# Patient Record
Sex: Male | Born: 1940 | Race: White | Hispanic: No | Marital: Married | Smoking: Former smoker
Health system: Southern US, Community
[De-identification: ages and names within clinical notes are randomized; demographics above are authoritative.]

## PROBLEM LIST (undated history)

## (undated) DIAGNOSIS — I10 Essential (primary) hypertension: Secondary | ICD-10-CM

## (undated) DIAGNOSIS — C801 Malignant (primary) neoplasm, unspecified: Secondary | ICD-10-CM

## (undated) DIAGNOSIS — E7800 Pure hypercholesterolemia, unspecified: Secondary | ICD-10-CM

## (undated) DIAGNOSIS — Z0389 Encounter for observation for other suspected diseases and conditions ruled out: Secondary | ICD-10-CM

## (undated) DIAGNOSIS — N261 Atrophy of kidney (terminal): Secondary | ICD-10-CM

## (undated) DIAGNOSIS — I714 Abdominal aortic aneurysm, without rupture: Secondary | ICD-10-CM

## (undated) DIAGNOSIS — I701 Atherosclerosis of renal artery: Secondary | ICD-10-CM

## (undated) HISTORY — DX: Pure hypercholesterolemia, unspecified: E78.00

## (undated) HISTORY — DX: Atherosclerosis of renal artery: I70.1

## (undated) HISTORY — DX: Abdominal aortic aneurysm, without rupture: I71.4

## (undated) HISTORY — DX: Encounter for observation for other suspected diseases and conditions ruled out: Z03.89

## (undated) HISTORY — DX: Essential (primary) hypertension: I10

---
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
## 1977-09-20 HISTORY — PX: GANGLION CYST EXCISION: SHX1691

## 1988-09-20 HISTORY — PX: VASECTOMY: SHX75

## 1993-09-20 HISTORY — PX: CARDIAC CATHETERIZATION: SHX172

## 1994-09-20 DIAGNOSIS — IMO0001 Reserved for inherently not codable concepts without codable children: Secondary | ICD-10-CM

## 1994-09-20 HISTORY — PX: OTHER SURGICAL HISTORY: SHX169

## 1994-09-20 HISTORY — DX: Reserved for inherently not codable concepts without codable children: IMO0001

## 1994-12-20 DIAGNOSIS — I701 Atherosclerosis of renal artery: Secondary | ICD-10-CM

## 1994-12-20 HISTORY — DX: Atherosclerosis of renal artery: I70.1

## 2010-03-30 ENCOUNTER — Ambulatory Visit: Payer: Self-pay | Admitting: Surgery

## 2010-10-19 ENCOUNTER — Ambulatory Visit
Admission: RE | Admit: 2010-10-19 | Discharge: 2010-10-19 | Payer: Self-pay | Source: Home / Self Care | Attending: Surgery | Admitting: Surgery

## 2010-10-19 ENCOUNTER — Encounter
Admission: RE | Admit: 2010-10-19 | Discharge: 2010-10-19 | Payer: Self-pay | Source: Home / Self Care | Attending: Surgery | Admitting: Surgery

## 2010-10-20 NOTE — Assessment & Plan Note (Signed)
OFFICE VISIT  JAELYNN, CURRIER DOB:  [birth year]                                       10/19/2010 EAVWU#:[id number]  Mr. Olden comes back in today for follow-up of his aneurysm.  He has had some previous ultrasounds that have identified a 4.3 x 4.6 cm aneurysm. He continues to be asymptomatic.  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 He is medically managed for his hypercholesterolemia, hypertension and heart failure.  PHYSICAL EXAMINATION:  Heart rate if 65, blood pressure 146/89, O2 saturation is 90%.  General:  Well-appearing in nn distress.  HEENT: Within normal limits.  Respirations nonlabored.  Cardiovascular: Regular rhythm.  No murmur.  No carotid bruits.  Abdomen:  Obese but soft.  No pulsatile mass.  Neurological:  H is intact.  Skin:  Without rash.  DIAGNOSTICS:  CT scan was performed today; this shows an infrarenal abdominal aortic aneurysm with maximum diameter of 4.5 cm, right iliac maximal diameter is 2.1 cm.  ASSESSMENT:  Abdominal aortic aneurysm.  PLAN:  The patient's aneurysm continues to enlarge but remains asymptomatic and it not yet to the size where I would recommend repair. Based on the images today, I still feel like he is a candidate for endovascular repair. I will plan to have him come back to see me in 6 months with a repeat ultrasound to determine whether or not he is  going to be a candidate for repair. I told him if he develops severe back pain on abdominal pain, I think he should go to the Emergency Department however I think rupture is unlikely at this time.    Jorge Ny, MD Electronically Signed  VWB/MEDQ  D:  10/19/2010  T:  10/20/2010  Job:  [id number]  cc:   Lucila Maine [facility]

## 2010-10-24 ENCOUNTER — Encounter: Payer: Self-pay | Admitting: Surgery

## 2011-02-02 NOTE — Assessment & Plan Note (Signed)
OFFICE VISIT   Joseph Contreras, Joseph Contreras  DOB:  07-04-1941                                       03/30/2010  VWUJW#:[id number]   REASON FOR VISIT:  Abdominal aortic aneurysm.   HISTORY:  This is a 70 year old gentleman I am seeing at the request of  Dr. Lorin Picket for evaluation of abdominal aortic aneurysm.  The patient is a  patient of Dr. Allyson Sabal at [facility].  They have been following his  aneurysm.  By ultrasound in April it measured 3.9 cm.  The patient 1  week later had re-ultrasound at the [place], and they identified a 4.3 x 4.6-  cm aneurysm.  I have been asked to help in managing this problem.  He  denies any abdominal pain at this time.   The patient suffers from hypertension which is medically managed.  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  He has only  one functioning kidney.  He suffers from hypercholesterolemia.  His most  recent LDL was 94.  He also has congestive heart failure which is  medically managed.   REVIEW OF SYSTEMS:  GENERAL:  Negative for fevers, chills weight gain,  weight loss.  CARDIAC:  Negative.  PULMONARY:  Negative.  GI:  Negative.  GU:  Positive for left renal artery occlusion.  VASCULAR:  Negative except for as above.  NEURO:  Negative.  MUSCULOSKELETAL:  Negative.  PSYCH:  Negative.  ENT:  Negative.  SKIN:  Negative except for rash on his elbow and his knees.  He also has  been recently treated for an episode of gout.   PAST MEDICAL HISTORY:  Hypertension, hypercholesterolemia, congestive  heart failure and gout.   FAMILY HISTORY:  Positive for early cardiovascular disease in his  father.   SOCIAL HISTORY:  He is married with 2 children.  History of smoking,  quit in 1968.  Does not drink.   ALLERGIES:  Codeine.   PHYSICAL EXAMINATION:  Heart rate 62, blood pressure 102/60, O2 sat 98%.  General:  He is well-appearing, in no distress.  HEENT:  Within normal  limits.  Lungs:  Clear bilaterally.  No  wheezes or rhonchi.  Cardiovascular:  Regular rate and rhythm.  No murmur.  No carotid bruit.  Palpable pedal and femoral pulses.  Abdomen:  Soft, nontender.  Musculoskeletal:  Without major deformity.  No edema.  Neuro:  No focal  weaknesses or deficits.  Skin:  Without rash.   DIAGNOSTICS:  I do not have images to review.  I have looked at the  reports from both [place] and the [place] regarding his aneurysm.   ASSESSMENT:  Abdominal aortic aneurysm.   PLAN:  I think the discrepancies within the 2 ultrasounds 1 week apart  most likely represent operator variability.  Regardless, I think that it  would be prudent to proceed with a CT angiogram to get the true diameter  of his aneurysm as well as to assess him for future interventions to  determine whether he is a candidate for endovascular treatment versus  open treatment.  I have ordered him to have a CT scan in 6 months, and I  will see him after that time.     Jorge Ny, MD  Electronically Signed   VWB/MEDQ  D:  03/30/2010  T:  03/31/2010  Job:  [id number]   cc:   Nanetta Batty, M.D.  Mervin Kung, M.D, [facility]

## 2011-03-29 ENCOUNTER — Encounter (INDEPENDENT_AMBULATORY_CARE_PROVIDER_SITE_OTHER): Payer: Medicare Other

## 2011-03-29 ENCOUNTER — Other Ambulatory Visit (INDEPENDENT_AMBULATORY_CARE_PROVIDER_SITE_OTHER): Payer: Medicare Other

## 2011-03-29 ENCOUNTER — Ambulatory Visit: Payer: Medicare Other | Admitting: Surgery

## 2011-03-29 DIAGNOSIS — I714 Abdominal aortic aneurysm, without rupture: Secondary | ICD-10-CM

## 2011-03-29 DIAGNOSIS — Z01810 Encounter for preprocedural cardiovascular examination: Secondary | ICD-10-CM

## 2011-03-29 DIAGNOSIS — I6529 Occlusion and stenosis of unspecified carotid artery: Secondary | ICD-10-CM

## 2011-04-12 NOTE — Procedures (Unsigned)
CAROTID DUPLEX EXAM  INDICATION:  Preoperative testing  HISTORY: Diabetes:  No Cardiac:  CHF Hypertension:  Yes Smoking:  Quit 1968 Previous Surgery:  No CV History:  Currently asymptomatic Amaurosis Fugax No, Paresthesias No, Hemiparesis No Other:  Hyperlipidemia                                      RIGHT             LEFT Brachial systolic pressure:         117               120 Brachial Doppler waveforms:         Normal            Normal Vertebral direction of flow:        Antegrade         Antegrade DUPLEX VELOCITIES (cm/sec) CCA peak systolic                   59                74 ECA peak systolic                   57                68 ICA peak systolic                   43                60 ICA end diastolic                   20                23 PLAQUE MORPHOLOGY:                  Mixed             Mixed PLAQUE AMOUNT:                      Mild              Mild PLAQUE LOCATION:                    Bifurcation       Bifurcation  IMPRESSION: 1. Bilateral internal carotid artery velocities suggest 1%-39%     stenosis. 2. Antegrade vertebral arteries bilaterally.  ___________________________________________ V. Charlena Cross, MD  EM/MEDQ  D:  03/29/2011  T:  03/29/2011  Job:  [id number]

## 2011-04-12 NOTE — Procedures (Unsigned)
DUPLEX ULTRASOUND OF ABDOMINAL AORTA  INDICATION:  AAA.  HISTORY: Diabetes:  No. Cardiac:  CHF. Hypertension:  Yes. Smoking:  Quit 1968. Connective Tissue Disorder: Family History:  No. Previous Surgery:  No.  DUPLEX EXAM:         AP (cm)                   TRANSVERSE (cm) Proximal             4.44 cm                   4.44 cm Mid                  4.61 cm                   4.61 cm Distal               3.91 cm                   3.91 cm Right Iliac          Not visualized            Not visualized Left Iliac           Not visualized            Not visualized  PREVIOUS:  Date:  10/19/2010 done by CT  AP:  4.5  TRANSVERSE:  IMPRESSION:  Abdominal aortic aneurysm noted with largest measurement today of 4.61 x 4.61 cm.  ___________________________________________ V. Charlena Cross, MD  EM/MEDQ  D:  03/29/2011  T:  03/29/2011  Job:  [id number]

## 2011-09-30 ENCOUNTER — Encounter (INDEPENDENT_AMBULATORY_CARE_PROVIDER_SITE_OTHER): Payer: Medicare Other | Admitting: Vascular Surgery

## 2011-09-30 DIAGNOSIS — I714 Abdominal aortic aneurysm, without rupture: Secondary | ICD-10-CM

## 2011-10-06 ENCOUNTER — Encounter: Payer: Self-pay | Admitting: Surgery

## 2011-10-06 DIAGNOSIS — E7800 Pure hypercholesterolemia, unspecified: Secondary | ICD-10-CM | POA: Insufficient documentation

## 2011-10-08 ENCOUNTER — Encounter: Payer: Self-pay | Admitting: Surgery

## 2011-10-11 ENCOUNTER — Ambulatory Visit (INDEPENDENT_AMBULATORY_CARE_PROVIDER_SITE_OTHER): Payer: Medicare Other | Admitting: Surgery

## 2011-10-11 ENCOUNTER — Encounter: Payer: Self-pay | Admitting: Surgery

## 2011-10-11 VITALS — BP 125/79 | HR 62 | Resp 16 | Ht 70.0 in | Wt 210.0 lb

## 2011-10-11 DIAGNOSIS — I714 Abdominal aortic aneurysm, without rupture: Secondary | ICD-10-CM | POA: Diagnosis not present

## 2011-10-11 NOTE — Progress Notes (Signed)
Vascular and Vein Specialist of [facility]   Patient name: Joseph Contreras MRN: [id number] DOB: Nov 09, 1940 Sex: male     Chief Complaint  Patient presents with  . AAA    follow up from AAA u/s done on 09-30-11    HISTORY OF PRESENT ILLNESS: The patient comes back today for followup of his abdominal aortic aneurysm. He denies having any abdominal pain or back pain.  His most recent blood work shows that his LDL is 118 and HDL is 32. His hemoglobin A1c was 6.3 his most recent creatinine was 1.1 he does have trace protein in his urine.  Past Medical History  Diagnosis Date  . AAA (abdominal aortic aneurysm)   . Hypertension   . Hypercholesterolemia   . Congestive heart failure   . Gout     Past Surgical History  Procedure Date  . Left renal stent     History   Social History  . Marital Status: Single    Spouse Name: N/A    Number of Children: N/A  . Years of Education: N/A   Occupational History  . Not on file.   Social History Main Topics  . Smoking status: Former Smoker    Quit date: 10/05/1966  . Smokeless tobacco: Not on file  . Alcohol Use: No  . Drug Use:   . Sexually Active:    Other Topics Concern  . Not on file   Social History Narrative  . No narrative on file    Family History  Problem Relation Age of Onset  . Heart disease Father     Allergies as of 10/11/2011 - Review Complete 10/11/2011  Allergen Reaction Noted  . Codeine  10/06/2011    Current Outpatient Prescriptions on File Prior to Visit  Medication Sig Dispense Refill  . aspirin 325 MG tablet Take 325 mg by mouth daily.      . benazepril (LOTENSIN) 20 MG tablet Take 10 mg by mouth daily.      . Cholecalciferol (VITAMIN D3) 1000 UNITS CAPS Take 1,000 capsules by mouth 2 (two) times daily.      . fish oil-omega-3 fatty acids 1000 MG capsule Take 3 g by mouth daily.      . Multiple Vitamin (MULTIVITAMIN) tablet Take 1 tablet by mouth daily.      . Potassium 99 MG TABS Take 99 mg by mouth  2 (two) times daily.      . simvastatin (ZOCOR) 80 MG tablet Take 40 mg by mouth at bedtime.          REVIEW OF SYSTEMS: Cardiovascular: No chest pain, chest pressure, palpitations, orthopnea, or dyspnea on exertion. No claudication or rest pain,  No history of DVT or phlebitis. Pulmonary: No productive cough, asthma or wheezing. Neurologic: No weakness, paresthesias, aphasia, or amaurosis. No dizziness. Hematologic: No bleeding problems or clotting disorders. Musculoskeletal: No joint pain or joint swelling. Gastrointestinal: No blood in stool or hematemesis Genitourinary: No dysuria or hematuria. Psychiatric:: No history of major depression. Integumentary: No rashes or ulcers. Constitutional: No fever or chills.  PHYSICAL EXAMINATION:   Vital signs are BP 125/79  Pulse 62  Resp 16  Ht 5\' 10"  (1.778 m)  Wt 210 lb (95.255 kg)  BMI 30.13 kg/m2  SpO2 96% General: The patient appears their stated age. HEENT:  No gross abnormalities Pulmonary:  Non labored breathing Abdomen: Soft and non-tender Musculoskeletal: There are no major deformities. Neurologic: No focal weakness or paresthesias are detected, Skin: There are no ulcer or  rashes noted. Psychiatric: The patient has normal affect. Cardiovascular: There is a regular rate and rhythm without significant murmur appreciated. Palpable posterior tibial pulses bilaterally left greater than right. No carotid bruit   Diagnostic Studies Ultrasound shows an increase in the size of his aneurysm it now measures 5.0 x 5.5  Assessment: Abdominal aortic aneurysm Plan: The patient will be scheduled for CT angiogram of the chest abdomen and pelvis to further violate the size of his aneurysm and to make sure he is still a candidate for endovascular repair. This will be scheduled within the next one to 2 weeks and further discussions were made based on the size of his aneurysm.  The patient is already undergone carotid ultrasound shows 1-39%  stenosis bilaterally  V. Charlena Cross, M.D. [facility] [place] Office: [phone] Pager:  [phone]

## 2011-10-11 NOTE — Progress Notes (Signed)
Addended by: Sharee Pimple on: 10/11/2011 10:04 AM   Modules accepted: Orders

## 2011-10-12 NOTE — Procedures (Unsigned)
DUPLEX ULTRASOUND OF ABDOMINAL AORTA  INDICATION:  Abdominal aortic aneurysm.  HISTORY: Diabetes:  No. Cardiac:  No. Hypertension:  Yes. Smoking:  Previous. Connective Tissue Disorder: Family History:  No. Previous Surgery:  No.  DUPLEX EXAM:         AP (cm)                   TRANSVERSE (cm) Proximal             2.54 cm                   2.43 cm Mid                  5.04 cm                   5.54 cm Distal               3.85 cm                   3.93 cm Right Iliac          1.43 cm                   1.58 cm Left Iliac           1.58 cm                   1.50 cm  PREVIOUS:  Date: 03/29/2011  AP:  4.6  TRANSVERSE:  4.6  IMPRESSION: 1. Infrarenal abdominal aortic aneurysm measuring 5.04 cm X 5.54 cm     without intramural thrombus present. 2. Diameters may be skewed due to mild vessel tortuosity in the mid     segment. 3. Slight increase in vessel diameters since previous study on     03/29/2011.  ___________________________________________ V. Charlena Cross, MD  SH/MEDQ  D:  09/30/2011  T:  09/30/2011  Job:  [id number]

## 2011-10-15 ENCOUNTER — Encounter: Payer: Self-pay | Admitting: Surgery

## 2011-10-18 ENCOUNTER — Ambulatory Visit
Admission: RE | Admit: 2011-10-18 | Discharge: 2011-10-18 | Disposition: A | Discharge status: Home / Self Care | Payer: Medicare Other | Source: Ambulatory Visit | Attending: Surgery | Admitting: Surgery

## 2011-10-18 ENCOUNTER — Encounter: Payer: Self-pay | Admitting: Surgery

## 2011-10-18 ENCOUNTER — Ambulatory Visit (INDEPENDENT_AMBULATORY_CARE_PROVIDER_SITE_OTHER): Payer: Medicare Other | Admitting: Surgery

## 2011-10-18 VITALS — BP 127/82 | HR 60 | Resp 16 | Ht 70.0 in | Wt 210.0 lb

## 2011-10-18 DIAGNOSIS — I714 Abdominal aortic aneurysm, without rupture: Secondary | ICD-10-CM

## 2011-10-18 DIAGNOSIS — N2889 Other specified disorders of kidney and ureter: Secondary | ICD-10-CM | POA: Diagnosis not present

## 2011-10-18 DIAGNOSIS — I723 Aneurysm of iliac artery: Secondary | ICD-10-CM | POA: Diagnosis not present

## 2011-10-18 MED ORDER — IOHEXOL 350 MG/ML SOLN
100.0000 mL | Freq: Once | INTRAVENOUS | Status: AC | PRN
  Administered 2011-10-18: 100 mL via INTRAVENOUS

## 2011-10-18 NOTE — Progress Notes (Signed)
Vascular and Vein Specialist of [facility]   Patient name: Joseph Contreras MRN: [id number] DOB: [birth year] Sex: male     Chief Complaint  Patient presents with  . AAA    one wk f/up w/CT scan  [facility].    HISTORY OF PRESENT ILLNESS: Patient comes in today for followup of his abdominal aortic aneurysm. He denies a, pain back pain or chest pain. He recently had an ultrasound that showed his aneurysm and grown to over 5-1/2 cm assistant for CT angiogram he comes back today to discuss the results.  Past Medical History  Diagnosis Date  . Hypertension   . Hypercholesterolemia   . Congestive heart failure   . Gout   . AAA (abdominal aortic aneurysm) 10/11/11    Past Surgical History  Procedure Date  . Left renal stent     History   Social History  . Marital Status: Married    Spouse Name: N/A    Number of Children: N/A  . Years of Education: N/A   Occupational History  . Not on file.   Social History Main Topics  . Smoking status: Former Smoker    Quit date: 10/05/1966  . Smokeless tobacco: Not on file  . Alcohol Use: No  . Drug Use:   . Sexually Active:    Other Topics Concern  . Not on file   Social History Narrative  . No narrative on file    Family History  Problem Relation Age of Onset  . Heart disease Father     Allergies as of 10/18/2011 - Review Complete 10/18/2011  Allergen Reaction Noted  . Codeine  10/06/2011    Current Outpatient Prescriptions on File Prior to Visit  Medication Sig Dispense Refill  . aspirin 325 MG tablet Take 325 mg by mouth daily.      . benazepril (LOTENSIN) 20 MG tablet Take 10 mg by mouth daily.      . Cholecalciferol (VITAMIN D3) 1000 UNITS CAPS Take 1,000 capsules by mouth 2 (two) times daily.      . fish oil-omega-3 fatty acids 1000 MG capsule Take 3 g by mouth daily.      . Multiple Vitamin (MULTIVITAMIN) tablet Take 1 tablet by mouth daily.      . Potassium 99 MG TABS Take 99 mg by mouth 2 (two) times daily.       . simvastatin (ZOCOR) 80 MG tablet Take 40 mg by mouth at bedtime.       . hydrochlorothiazide (MICROZIDE) 12.5 MG capsule Take 12.5 mg by mouth daily.       Current Facility-Administered Medications on File Prior to Visit  Medication Dose Route Frequency Provider Last Rate Last Dose  . iohexol (OMNIPAQUE) 350 MG/ML injection 100 mL  100 mL Intravenous Once PRN Medication Radiologist, MD   100 mL at 10/18/11 0835     REVIEW OF SYSTEMS: No changes from prior visit  PHYSICAL EXAMINATION:   Vital signs are BP 127/82  Pulse 60  Resp 16  Ht 5\' 10"  (1.778 m)  Wt 210 lb (95.255 kg)  BMI 30.13 kg/m2  SpO2 97% General: The patient appears their stated age. HEENT:  No gross abnormalities Pulmonary:  Non labored breathing Abdomen: Soft and non-tender Musculoskeletal: There are no major deformities. Neurologic: No focal weakness or paresthesias are detected, Skin: There are no ulcer or rashes noted. Psychiatric: The patient has normal affect. Cardiovascular: Palpable posterior tibial pulses bilaterally   Diagnostic Studies I have reviewed his CT angiogram.  Has not been formally read by radiology his aneurysm measures 5.0 x 5.5 cm.  Assessment: Abdominal aortic aneurysm Plan: I have discussed the options for treatment including open versus endovascular therapies. We've decided to proceed with endovascular repair. This hopefully will be able to be done percutaneously. We discussed the risks which include lower extremity arterial embolization intestinal ischemia stroke heart attack death. We also discussed the possibility of an endoleak and future treatment as well as long-term followup. All his questions have been answered to his operation is been scheduled for Friday, February 7  V. Charlena Cross, M.D. [facility] [place] Office: [phone] Pager:  [phone]

## 2011-10-19 DIAGNOSIS — Z01810 Encounter for preprocedural cardiovascular examination: Secondary | ICD-10-CM | POA: Diagnosis not present

## 2011-10-19 DIAGNOSIS — E782 Mixed hyperlipidemia: Secondary | ICD-10-CM | POA: Diagnosis not present

## 2011-10-19 DIAGNOSIS — I10 Essential (primary) hypertension: Secondary | ICD-10-CM | POA: Diagnosis not present

## 2011-10-19 DIAGNOSIS — I714 Abdominal aortic aneurysm, without rupture: Secondary | ICD-10-CM | POA: Diagnosis not present

## 2011-10-20 ENCOUNTER — Other Ambulatory Visit: Payer: Self-pay

## 2011-10-20 DIAGNOSIS — I714 Abdominal aortic aneurysm, without rupture: Secondary | ICD-10-CM | POA: Diagnosis not present

## 2011-10-20 DIAGNOSIS — I701 Atherosclerosis of renal artery: Secondary | ICD-10-CM | POA: Diagnosis not present

## 2011-10-20 DIAGNOSIS — E782 Mixed hyperlipidemia: Secondary | ICD-10-CM | POA: Diagnosis not present

## 2011-10-20 DIAGNOSIS — I10 Essential (primary) hypertension: Secondary | ICD-10-CM | POA: Diagnosis not present

## 2011-10-22 ENCOUNTER — Encounter (HOSPITAL_COMMUNITY): Payer: Self-pay | Admitting: Respiratory Therapy

## 2011-10-22 HISTORY — PX: ABDOMINAL AORTIC ENDOVASCULAR STENT GRAFT: SHX5707

## 2011-10-25 ENCOUNTER — Ambulatory Visit: Payer: Medicare Other | Admitting: Surgery

## 2011-10-26 HISTORY — PX: EYE SURGERY: SHX253

## 2011-10-27 ENCOUNTER — Other Ambulatory Visit: Payer: Self-pay

## 2011-10-27 ENCOUNTER — Encounter (HOSPITAL_COMMUNITY): Payer: Self-pay

## 2011-10-27 ENCOUNTER — Encounter (HOSPITAL_COMMUNITY)
Admission: RE | Admit: 2011-10-27 | Discharge: 2011-10-27 | Disposition: A | Discharge status: Home / Self Care | Payer: Medicare Other | Source: Ambulatory Visit | Attending: Surgery | Admitting: Surgery

## 2011-10-27 ENCOUNTER — Encounter (HOSPITAL_COMMUNITY)
Admission: RE | Admit: 2011-10-27 | Discharge: 2011-10-27 | Disposition: A | Discharge status: Home / Self Care | Payer: Medicare Other | Source: Ambulatory Visit | Attending: Anesthesiology | Admitting: Anesthesiology

## 2011-10-27 DIAGNOSIS — M109 Gout, unspecified: Secondary | ICD-10-CM | POA: Diagnosis not present

## 2011-10-27 DIAGNOSIS — I509 Heart failure, unspecified: Secondary | ICD-10-CM | POA: Diagnosis not present

## 2011-10-27 DIAGNOSIS — Z01811 Encounter for preprocedural respiratory examination: Secondary | ICD-10-CM | POA: Diagnosis not present

## 2011-10-27 DIAGNOSIS — I714 Abdominal aortic aneurysm, without rupture: Secondary | ICD-10-CM | POA: Diagnosis not present

## 2011-10-27 DIAGNOSIS — E7800 Pure hypercholesterolemia, unspecified: Secondary | ICD-10-CM | POA: Diagnosis not present

## 2011-10-27 HISTORY — DX: Atrophy of kidney (terminal): N26.1

## 2011-10-27 HISTORY — DX: Malignant (primary) neoplasm, unspecified: C80.1

## 2011-10-27 LAB — SURGICAL PCR SCREEN
MRSA, PCR: NEGATIVE
Staphylococcus aureus: NEGATIVE

## 2011-10-27 LAB — CBC
MCH: 32.7 pg (ref 26.0–34.0)
MCHC: 34.8 g/dL (ref 30.0–36.0)
MCV: 94.2 fL (ref 78.0–100.0)
Platelets: 176 10*3/uL (ref 150–400)
RBC: 4.49 MIL/uL (ref 4.22–5.81)
RDW: 13.9 % (ref 11.5–15.5)

## 2011-10-27 LAB — COMPREHENSIVE METABOLIC PANEL
ALT: 27 U/L (ref 0–53)
AST: 24 U/L (ref 0–37)
Albumin: 3.7 g/dL (ref 3.5–5.2)
CO2: 23 mEq/L (ref 19–32)
Chloride: 105 mEq/L (ref 96–112)
GFR calc non Af Amer: 82 mL/min — ABNORMAL LOW (ref >90)
Sodium: 139 mEq/L (ref 135–145)
Total Bilirubin: 0.2 mg/dL — ABNORMAL LOW (ref 0.3–1.2)

## 2011-10-27 LAB — URINALYSIS, ROUTINE W REFLEX MICROSCOPIC
Bilirubin Urine: NEGATIVE
Glucose, UA: NEGATIVE mg/dL
Hgb urine dipstick: NEGATIVE
Ketones, ur: NEGATIVE mg/dL
Leukocytes, UA: NEGATIVE
Nitrite: NEGATIVE
Protein, ur: NEGATIVE mg/dL
Specific Gravity, Urine: 1.02 (ref 1.005–1.030)
Urobilinogen, UA: 1 mg/dL (ref 0.0–1.0)
pH: 7.5 (ref 5.0–8.0)

## 2011-10-27 LAB — APTT: aPTT: 28 seconds (ref 24–37)

## 2011-10-27 LAB — BLOOD GAS, ARTERIAL
Drawn by: 344381
FIO2: 0.21 %
O2 Saturation: 97 %
pO2, Arterial: 82.1 mmHg (ref 80.0–100.0)

## 2011-10-27 LAB — ABO/RH: ABO/RH(D): O POS

## 2011-10-27 LAB — PROTIME-INR: Prothrombin Time: 12.7 seconds (ref 11.6–15.2)

## 2011-10-27 NOTE — Progress Notes (Signed)
Stress test,notes,echo if done req from dr berry at [facility] by [facility]

## 2011-10-27 NOTE — Pre-Procedure Instructions (Addendum)
20 Joseph Contreras  10/27/2011   Your procedure is scheduled on:  2.8.13  Report to [facility] at 530AM.  Call this number if you have problems the morning of surgery: [phone]   Remember:   Do not eat food:After Midnight.  May have clear liquids: up to 4 Hours before arrival.  Clear liquids include soda, tea, black coffee, apple or grape juice, broth.  Take these medicines the morning of surgery with A SIP OF WATER:STOP  fish oil , vitamins now   Do not wear jewelry, make-up or nail polish.  Do not wear lotions, powders, or perfumes. You may wear deodorant.  Do not shave 48 hours prior to surgery.  Do not bring valuables to the hospital.  Contacts, dentures or bridgework may not be worn into surgery.  Leave suitcase in the car. After surgery it may be brought to your room.  For patients admitted to the hospital, checkout time is 11:00 AM the day of discharge.   Patients discharged the day of surgery will not be allowed to drive home.  Name and phone number of your driver:[phone]   Special Instructions: CHG Shower Use Special Wash: 1/2 bottle night before surgery and 1/2 bottle morning of surgery.   Please read over the following fact sheets that you were given: Pain Booklet, Coughing and Deep Breathing, Blood Transfusion Information, MRSA Information and Surgical Site Infection Prevention

## 2011-10-28 MED ORDER — DEXTROSE 5 % IV SOLN
1.5000 g | Freq: Once | INTRAVENOUS | Status: AC
  Administered 2011-10-29: 1.5 g via INTRAVENOUS
  Filled 2011-10-28: qty 1.5

## 2011-10-29 ENCOUNTER — Other Ambulatory Visit: Payer: Self-pay | Admitting: Thoracic Diseases

## 2011-10-29 ENCOUNTER — Encounter (HOSPITAL_COMMUNITY)
Admission: RE | Disposition: A | Discharge status: Home / Self Care | Payer: Self-pay | Source: Ambulatory Visit | Attending: Surgery

## 2011-10-29 ENCOUNTER — Ambulatory Visit (HOSPITAL_COMMUNITY): Payer: Medicare Other

## 2011-10-29 ENCOUNTER — Inpatient Hospital Stay (HOSPITAL_COMMUNITY)
Admission: RE | Admit: 2011-10-29 | Discharge: 2011-10-30 | DRG: 238 | Disposition: A | Discharge status: Home / Self Care | Payer: Medicare Other | Source: Ambulatory Visit | Attending: Surgery | Admitting: Surgery

## 2011-10-29 ENCOUNTER — Ambulatory Visit (HOSPITAL_COMMUNITY): Payer: Medicare Other | Admitting: Certified Registered"

## 2011-10-29 ENCOUNTER — Encounter (HOSPITAL_COMMUNITY): Payer: Self-pay | Admitting: *Deleted

## 2011-10-29 ENCOUNTER — Encounter (HOSPITAL_COMMUNITY): Payer: Self-pay | Admitting: Certified Registered"

## 2011-10-29 DIAGNOSIS — I509 Heart failure, unspecified: Secondary | ICD-10-CM | POA: Diagnosis present

## 2011-10-29 DIAGNOSIS — E7800 Pure hypercholesterolemia, unspecified: Secondary | ICD-10-CM | POA: Diagnosis present

## 2011-10-29 DIAGNOSIS — I714 Abdominal aortic aneurysm, without rupture, unspecified: Secondary | ICD-10-CM | POA: Diagnosis not present

## 2011-10-29 DIAGNOSIS — Z7982 Long term (current) use of aspirin: Secondary | ICD-10-CM

## 2011-10-29 DIAGNOSIS — Z01818 Encounter for other preprocedural examination: Secondary | ICD-10-CM | POA: Diagnosis not present

## 2011-10-29 DIAGNOSIS — I10 Essential (primary) hypertension: Secondary | ICD-10-CM | POA: Diagnosis not present

## 2011-10-29 DIAGNOSIS — J189 Pneumonia, unspecified organism: Secondary | ICD-10-CM | POA: Diagnosis not present

## 2011-10-29 DIAGNOSIS — Z79899 Other long term (current) drug therapy: Secondary | ICD-10-CM | POA: Diagnosis not present

## 2011-10-29 DIAGNOSIS — Z09 Encounter for follow-up examination after completed treatment for conditions other than malignant neoplasm: Secondary | ICD-10-CM | POA: Diagnosis not present

## 2011-10-29 DIAGNOSIS — Z87891 Personal history of nicotine dependence: Secondary | ICD-10-CM

## 2011-10-29 DIAGNOSIS — Z23 Encounter for immunization: Secondary | ICD-10-CM | POA: Diagnosis not present

## 2011-10-29 DIAGNOSIS — Z01812 Encounter for preprocedural laboratory examination: Secondary | ICD-10-CM

## 2011-10-29 DIAGNOSIS — Z01810 Encounter for preprocedural cardiovascular examination: Secondary | ICD-10-CM

## 2011-10-29 DIAGNOSIS — N269 Renal sclerosis, unspecified: Secondary | ICD-10-CM | POA: Diagnosis present

## 2011-10-29 DIAGNOSIS — Z85820 Personal history of malignant melanoma of skin: Secondary | ICD-10-CM

## 2011-10-29 DIAGNOSIS — R0989 Other specified symptoms and signs involving the circulatory and respiratory systems: Secondary | ICD-10-CM | POA: Diagnosis not present

## 2011-10-29 DIAGNOSIS — M109 Gout, unspecified: Secondary | ICD-10-CM | POA: Diagnosis present

## 2011-10-29 HISTORY — DX: Abdominal aortic aneurysm, without rupture, unspecified: I71.40

## 2011-10-29 HISTORY — DX: Abdominal aortic aneurysm, without rupture: I71.4

## 2011-10-29 LAB — BASIC METABOLIC PANEL
BUN: 12 mg/dL (ref 6–23)
CO2: 25 mEq/L (ref 19–32)
Chloride: 109 mEq/L (ref 96–112)
Creatinine, Ser: 0.99 mg/dL (ref 0.50–1.35)
Glucose, Bld: 100 mg/dL — ABNORMAL HIGH (ref 70–99)

## 2011-10-29 LAB — CBC
HCT: 37.3 % — ABNORMAL LOW (ref 39.0–52.0)
MCV: 95.9 fL (ref 78.0–100.0)
RBC: 3.89 MIL/uL — ABNORMAL LOW (ref 4.22–5.81)
WBC: 6.7 10*3/uL (ref 4.0–10.5)

## 2011-10-29 LAB — MAGNESIUM: Magnesium: 2.1 mg/dL (ref 1.5–2.5)

## 2011-10-29 SURGERY — INSERTION, ENDOVASCULAR STENT GRAFT, AORTA, ABDOMINAL
Anesthesia: General | Wound class: Clean

## 2011-10-29 MED ORDER — ACETAMINOPHEN 325 MG PO TABS
325.0000 mg | ORAL_TABLET | ORAL | Status: DC | PRN
Start: 2011-10-29 — End: 2011-10-30

## 2011-10-29 MED ORDER — MIDAZOLAM HCL 5 MG/5ML IJ SOLN
INTRAMUSCULAR | Status: DC | PRN
  Administered 2011-10-29: 2 mg via INTRAVENOUS

## 2011-10-29 MED ORDER — HEPARIN SODIUM (PORCINE) 1000 UNIT/ML IJ SOLN
INTRAMUSCULAR | Status: DC | PRN
  Administered 2011-10-29: 7000 [IU] via INTRAVENOUS

## 2011-10-29 MED ORDER — GLYCOPYRROLATE 0.2 MG/ML IJ SOLN
INTRAMUSCULAR | Status: DC | PRN
  Administered 2011-10-29: 0.1 mg via INTRAVENOUS
  Administered 2011-10-29: .6 mg via INTRAVENOUS

## 2011-10-29 MED ORDER — MEPERIDINE HCL 25 MG/ML IJ SOLN: 6.2500 mg | INTRAMUSCULAR | Status: DC | PRN

## 2011-10-29 MED ORDER — ONDANSETRON HCL 4 MG/2ML IJ SOLN: 4.0000 mg | Freq: Four times a day (QID) | INTRAMUSCULAR | Status: DC | PRN

## 2011-10-29 MED ORDER — ASPIRIN 325 MG PO TABS
325.0000 mg | ORAL_TABLET | Freq: Every day | ORAL | Status: DC
  Filled 2011-10-29: qty 1

## 2011-10-29 MED ORDER — LABETALOL HCL 5 MG/ML IV SOLN: 10.0000 mg | INTRAVENOUS | Status: DC | PRN

## 2011-10-29 MED ORDER — SODIUM CHLORIDE 0.9 % IJ SOLN
INTRAVENOUS | Status: DC | PRN
  Administered 2011-10-29: 09:00:00 via INTRAMUSCULAR

## 2011-10-29 MED ORDER — POTASSIUM CHLORIDE CRYS ER 20 MEQ PO TBCR: 20.0000 meq | EXTENDED_RELEASE_TABLET | Freq: Once | ORAL | Status: AC | PRN

## 2011-10-29 MED ORDER — PHENYLEPHRINE HCL 10 MG/ML IJ SOLN
10.0000 mg | INTRAVENOUS | Status: DC | PRN
  Administered 2011-10-29: 10 ug/min via INTRAVENOUS

## 2011-10-29 MED ORDER — OXYCODONE-ACETAMINOPHEN 5-325 MG PO TABS: 1.0000 | ORAL_TABLET | ORAL | Status: DC | PRN

## 2011-10-29 MED ORDER — MAGNESIUM SULFATE 40 MG/ML IJ SOLN
2.0000 g | Freq: Once | INTRAMUSCULAR | Status: AC | PRN
  Filled 2011-10-29: qty 50

## 2011-10-29 MED ORDER — DEXTROSE-NACL 5-0.9 % IV SOLN
INTRAVENOUS | Status: DC
  Administered 2011-10-29: 10:00:00 via INTRAVENOUS

## 2011-10-29 MED ORDER — LACTATED RINGERS IV SOLN
INTRAVENOUS | Status: DC | PRN
  Administered 2011-10-29: 07:00:00 via INTRAVENOUS

## 2011-10-29 MED ORDER — PHENOL 1.4 % MT LIQD: 1.0000 | OROMUCOSAL | Status: DC | PRN

## 2011-10-29 MED ORDER — MORPHINE SULFATE 2 MG/ML IJ SOLN: 0.0500 mg/kg | INTRAMUSCULAR | Status: DC | PRN

## 2011-10-29 MED ORDER — DOPAMINE-DEXTROSE 3.2-5 MG/ML-% IV SOLN: 3.0000 ug/kg/min | INTRAVENOUS | Status: DC

## 2011-10-29 MED ORDER — ROCURONIUM BROMIDE 100 MG/10ML IV SOLN
INTRAVENOUS | Status: DC | PRN
  Administered 2011-10-29 (×2): 10 mg via INTRAVENOUS
  Administered 2011-10-29: 50 mg via INTRAVENOUS

## 2011-10-29 MED ORDER — ONDANSETRON HCL 4 MG/2ML IJ SOLN
INTRAMUSCULAR | Status: DC | PRN
  Administered 2011-10-29: 4 mg via INTRAVENOUS

## 2011-10-29 MED ORDER — ONDANSETRON HCL 4 MG/2ML IJ SOLN: 4.0000 mg | Freq: Once | INTRAMUSCULAR | Status: DC | PRN

## 2011-10-29 MED ORDER — HYDRALAZINE HCL 20 MG/ML IJ SOLN
10.0000 mg | INTRAMUSCULAR | Status: DC | PRN
  Filled 2011-10-29: qty 0.5

## 2011-10-29 MED ORDER — PANTOPRAZOLE SODIUM 40 MG PO TBEC
40.0000 mg | DELAYED_RELEASE_TABLET | Freq: Every day | ORAL | Status: DC
  Filled 2011-10-29: qty 1

## 2011-10-29 MED ORDER — DOCUSATE SODIUM 100 MG PO CAPS
100.0000 mg | ORAL_CAPSULE | Freq: Every day | ORAL | Status: DC
Start: 2011-10-30 — End: 2011-10-30
  Filled 2011-10-29: qty 1

## 2011-10-29 MED ORDER — PROTAMINE SULFATE 10 MG/ML IV SOLN
INTRAVENOUS | Status: DC | PRN
  Administered 2011-10-29 (×5): 10 mg via INTRAVENOUS

## 2011-10-29 MED ORDER — HYDROMORPHONE HCL PF 1 MG/ML IJ SOLN: 0.2500 mg | INTRAMUSCULAR | Status: DC | PRN

## 2011-10-29 MED ORDER — NEOSTIGMINE METHYLSULFATE 1 MG/ML IJ SOLN
INTRAMUSCULAR | Status: DC | PRN
  Administered 2011-10-29: 4 mg via INTRAVENOUS

## 2011-10-29 MED ORDER — SODIUM CHLORIDE 0.9 % IV SOLN
INTRAVENOUS | Status: DC | PRN
  Administered 2011-10-29: 07:00:00 via INTRAVENOUS

## 2011-10-29 MED ORDER — GUAIFENESIN-DM 100-10 MG/5ML PO SYRP: 15.0000 mL | ORAL_SOLUTION | ORAL | Status: DC | PRN

## 2011-10-29 MED ORDER — PROPOFOL 10 MG/ML IV EMUL
INTRAVENOUS | Status: DC | PRN
  Administered 2011-10-29: 200 mg via INTRAVENOUS

## 2011-10-29 MED ORDER — BENAZEPRIL HCL 20 MG PO TABS
20.0000 mg | ORAL_TABLET | Freq: Every day | ORAL | Status: DC
  Filled 2011-10-29: qty 1

## 2011-10-29 MED ORDER — POTASSIUM 99 MG PO TABS: 99.0000 mg | ORAL_TABLET | Freq: Two times a day (BID) | ORAL | Status: DC

## 2011-10-29 MED ORDER — SODIUM CHLORIDE 0.9 % IV SOLN: INTRAVENOUS | Status: DC

## 2011-10-29 MED ORDER — SODIUM CHLORIDE 0.9 % IR SOLN
Status: DC | PRN
  Administered 2011-10-29: 08:00:00

## 2011-10-29 MED ORDER — IODIXANOL 320 MG/ML IV SOLN
INTRAVENOUS | Status: DC | PRN
  Administered 2011-10-29: 34 mL via INTRA_ARTERIAL

## 2011-10-29 MED ORDER — SODIUM CHLORIDE 0.9 % IV SOLN: 500.0000 mL | Freq: Once | INTRAVENOUS | Status: AC | PRN

## 2011-10-29 MED ORDER — ACETAMINOPHEN 650 MG RE SUPP: 325.0000 mg | RECTAL | Status: DC | PRN

## 2011-10-29 MED ORDER — FENTANYL CITRATE 0.05 MG/ML IJ SOLN
INTRAMUSCULAR | Status: DC | PRN
  Administered 2011-10-29: 100 ug via INTRAVENOUS
  Administered 2011-10-29: 25 ug via INTRAVENOUS
  Administered 2011-10-29: 50 ug via INTRAVENOUS

## 2011-10-29 MED ORDER — METOPROLOL TARTRATE 1 MG/ML IV SOLN: 2.0000 mg | INTRAVENOUS | Status: DC | PRN

## 2011-10-29 MED ORDER — MORPHINE SULFATE 2 MG/ML IJ SOLN: 2.0000 mg | INTRAMUSCULAR | Status: DC | PRN

## 2011-10-29 MED ORDER — DEXTROSE 5 % IV SOLN
1.5000 g | Freq: Two times a day (BID) | INTRAVENOUS | Status: AC
  Administered 2011-10-29 – 2011-10-30 (×2): 1.5 g via INTRAVENOUS
  Filled 2011-10-29 (×2): qty 1.5

## 2011-10-29 MED ORDER — BUMETANIDE 0.5 MG PO TABS
0.5000 mg | ORAL_TABLET | Freq: Every day | ORAL | Status: DC
  Filled 2011-10-29: qty 1

## 2011-10-29 MED ORDER — ROSUVASTATIN CALCIUM 20 MG PO TABS
20.0000 mg | ORAL_TABLET | Freq: Every day | ORAL | Status: DC
  Administered 2011-10-29: 20 mg via ORAL
  Filled 2011-10-29 (×2): qty 1

## 2011-10-29 SURGICAL SUPPLY — 79 items
BAG DECANTER FOR FLEXI CONT (MISCELLANEOUS) IMPLANT
BAG SNAP BAND KOVER 36X36 (MISCELLANEOUS) ×2 IMPLANT
BALLN CODA OCL 2-9.0-35-120-3 (BALLOONS)
BALLOON COD OCL 2-9.0-35-120-3 (BALLOONS) IMPLANT
CANISTER SUCTION 2500CC (MISCELLANEOUS) ×2 IMPLANT
CATH BEACON 5.038 65CM KMP-01 (CATHETERS) ×2 IMPLANT
CATH OMNI FLUSH .035X70CM (CATHETERS) ×2 IMPLANT
CLIP TI MEDIUM 24 (CLIP) IMPLANT
CLIP TI WIDE RED SMALL 24 (CLIP) IMPLANT
CLOTH BEACON ORANGE TIMEOUT ST (SAFETY) ×2 IMPLANT
COVER MAYO STAND STRL (DRAPES) ×2 IMPLANT
COVER PROBE W GEL 5X96 (DRAPES) ×2 IMPLANT
COVER SURGICAL LIGHT HANDLE (MISCELLANEOUS) ×4 IMPLANT
DERMABOND ADVANCED (GAUZE/BANDAGES/DRESSINGS) ×2
DERMABOND ADVANCED .7 DNX12 (GAUZE/BANDAGES/DRESSINGS) ×2 IMPLANT
DEVICE CLOSURE PERCLS PRGLD 6F (VASCULAR PRODUCTS) ×4 IMPLANT
DEVICE TORQUE 50000 (MISCELLANEOUS) IMPLANT
DRAIN CHANNEL 10F 3/8 F FF (DRAIN) IMPLANT
DRAIN CHANNEL 10M FLAT 3/4 FLT (DRAIN) IMPLANT
DRAPE C-ARM 42X72 X-RAY (DRAPES) ×2 IMPLANT
DRAPE TABLE COVER HEAVY DUTY (DRAPES) ×2 IMPLANT
DRSG TEGADERM 2.38X2.75 (GAUZE/BANDAGES/DRESSINGS) ×4 IMPLANT
ELECT REM PT RETURN 9FT ADLT (ELECTROSURGICAL) ×4
ELECTRODE REM PT RTRN 9FT ADLT (ELECTROSURGICAL) ×2 IMPLANT
EVACUATOR 3/16  PVC DRAIN (DRAIN)
EVACUATOR 3/16 PVC DRAIN (DRAIN) IMPLANT
EVACUATOR SILICONE 100CC (DRAIN) IMPLANT
GLOVE BIOGEL PI IND STRL 6.5 (GLOVE) ×2 IMPLANT
GLOVE BIOGEL PI IND STRL 7.0 (GLOVE) ×2 IMPLANT
GLOVE BIOGEL PI IND STRL 7.5 (GLOVE) ×2 IMPLANT
GLOVE BIOGEL PI INDICATOR 6.5 (GLOVE) ×2
GLOVE BIOGEL PI INDICATOR 7.0 (GLOVE) ×2
GLOVE BIOGEL PI INDICATOR 7.5 (GLOVE) ×2
GLOVE SS BIOGEL STRL SZ 6.5 (GLOVE) ×1 IMPLANT
GLOVE SS BIOGEL STRL SZ 7 (GLOVE) ×1 IMPLANT
GLOVE SUPERSENSE BIOGEL SZ 6.5 (GLOVE) ×1
GLOVE SUPERSENSE BIOGEL SZ 7 (GLOVE) ×1
GLOVE SURG SS PI 7.5 STRL IVOR (GLOVE) ×6 IMPLANT
GOWN PREVENTION PLUS XXLARGE (GOWN DISPOSABLE) ×2 IMPLANT
GOWN STRL NON-REIN LRG LVL3 (GOWN DISPOSABLE) ×8 IMPLANT
GRAFT BALLN CATH 65CM (STENTS) ×1 IMPLANT
GRAFT ENDOPROSETHESIS 28/14/16 (Endovascular Graft) ×2 IMPLANT
GRAFT EXCLUDER LEG 14.5X12 (Endovascular Graft) ×2 IMPLANT
HEMOSTAT SNOW SURGICEL 2X4 (HEMOSTASIS) IMPLANT
HEMOSTAT SURGICEL 2X14 (HEMOSTASIS) IMPLANT
INTRODUCER PERFORM 12 30 .038 (SHEATH) ×2 IMPLANT
KIT BASIN OR (CUSTOM PROCEDURE TRAY) ×2 IMPLANT
KIT ROOM TURNOVER OR (KITS) ×2 IMPLANT
NEEDLE PERC 18GX7CM (NEEDLE) ×2 IMPLANT
NS IRRIG 1000ML POUR BTL (IV SOLUTION) ×2 IMPLANT
PACK AORTA (CUSTOM PROCEDURE TRAY) ×2 IMPLANT
PAD ARMBOARD 7.5X6 YLW CONV (MISCELLANEOUS) ×4 IMPLANT
PENCIL BUTTON HOLSTER BLD 10FT (ELECTRODE) IMPLANT
PERCLOSE PROGLIDE 6F (VASCULAR PRODUCTS) ×8
SHEATH AVANTI 11CM 8FR (MISCELLANEOUS) ×2 IMPLANT
SHEATH BRITE TIP 8FR 23CM (MISCELLANEOUS) ×2 IMPLANT
SHEATH INTRODUCER 18X30 (VASCULAR PRODUCTS) ×1
SHEATH PERFORMER 18FRX30 (VASCULAR PRODUCTS) ×1 IMPLANT
STAPLER VISISTAT 35W (STAPLE) IMPLANT
STENT GRAFT BALLN CATH 65CM (STENTS) ×1
STOPCOCK MORSE 400PSI 3WAY (MISCELLANEOUS) ×2 IMPLANT
SUT ETHILON 3 0 PS 1 (SUTURE) IMPLANT
SUT PROLENE 5 0 C 1 24 (SUTURE) IMPLANT
SUT VIC AB 2-0 CT1 36 (SUTURE) IMPLANT
SUT VIC AB 3-0 SH 27 (SUTURE)
SUT VIC AB 3-0 SH 27X BRD (SUTURE) IMPLANT
SUT VICRYL 4-0 PS2 18IN ABS (SUTURE) ×4 IMPLANT
SYR 20CC LL (SYRINGE) ×4 IMPLANT
SYR 30ML LL (SYRINGE) IMPLANT
SYR 5ML LL (SYRINGE) IMPLANT
SYR MEDRAD MARK V 150ML (SYRINGE) ×2 IMPLANT
SYRINGE 10CC LL (SYRINGE) ×6 IMPLANT
TOWEL OR 17X24 6PK STRL BLUE (TOWEL DISPOSABLE) ×4 IMPLANT
TOWEL OR 17X26 10 PK STRL BLUE (TOWEL DISPOSABLE) ×4 IMPLANT
TRAY FOLEY CATH 14FRSI W/METER (CATHETERS) ×2 IMPLANT
TUBING HIGH PRESSURE 120CM (CONNECTOR) ×2 IMPLANT
WATER STERILE IRR 1000ML POUR (IV SOLUTION) ×2 IMPLANT
WIRE AMPLATZ SS-J .035X180CM (WIRE) ×4 IMPLANT
WIRE BENTSON .035X145CM (WIRE) ×4 IMPLANT

## 2011-10-29 NOTE — Transfer of Care (Signed)
Immediate Anesthesia Transfer of Care Note  Patient: Joseph Contreras  Procedure(s) Performed:  ABDOMINAL AORTIC ENDOVASCULAR STENT GRAFT - [facility]- [facility]  Patient Location: PACU  Anesthesia Type: General  Level of Consciousness: awake  Airway & Oxygen Therapy: Patient Spontanous Breathing  Post-op Assessment: Report given to PACU RN  Post vital signs: stable  Complications: No apparent anesthesia complications

## 2011-10-29 NOTE — Anesthesia Postprocedure Evaluation (Signed)
Anesthesia Post Note  Patient: Joseph Contreras  Procedure(s) Performed:  ABDOMINAL AORTIC ENDOVASCULAR STENT GRAFT - [facility]- [facility]  Anesthesia type: general  Patient location: PACU  Post pain: Pain level controlled  Post assessment: Patient's Cardiovascular Status Stable  Last Vitals:  Filed Vitals:   10/29/11 1130  BP: 127/69  Pulse: 52  Temp:   Resp: 21    Post vital signs: Reviewed and stable  Level of consciousness: sedated  Complications: No apparent anesthesia complications

## 2011-10-29 NOTE — H&P (View-Only) (Signed)
Vascular and Vein Specialist of [facility]   Patient name: Agustin Stailey MRN: [id number] DOB: 07/31/1941 Sex: male     Chief Complaint  Patient presents with  . AAA    follow up from AAA u/s done on 09-30-11    HISTORY OF PRESENT ILLNESS: The patient comes back today for followup of his abdominal aortic aneurysm. He denies having any abdominal pain or back pain.  His most recent blood work shows that his LDL is 118 and HDL is 32. His hemoglobin A1c was 6.3 his most recent creatinine was 1.1 he does have trace protein in his urine.  Past Medical History  Diagnosis Date  . AAA (abdominal aortic aneurysm)   . Hypertension   . Hypercholesterolemia   . Congestive heart failure   . Gout     Past Surgical History  Procedure Date  . Left renal stent     History   Social History  . Marital Status: Single    Spouse Name: N/A    Number of Children: N/A  . Years of Education: N/A   Occupational History  . Not on file.   Social History Main Topics  . Smoking status: Former Smoker    Quit date: 10/05/1966  . Smokeless tobacco: Not on file  . Alcohol Use: No  . Drug Use:   . Sexually Active:    Other Topics Concern  . Not on file   Social History Narrative  . No narrative on file    Family History  Problem Relation Age of Onset  . Heart disease Father     Allergies as of 10/11/2011 - Review Complete 10/11/2011  Allergen Reaction Noted  . Codeine  10/06/2011    Current Outpatient Prescriptions on File Prior to Visit  Medication Sig Dispense Refill  . aspirin 325 MG tablet Take 325 mg by mouth daily.      . benazepril (LOTENSIN) 20 MG tablet Take 10 mg by mouth daily.      . Cholecalciferol (VITAMIN D3) 1000 UNITS CAPS Take 1,000 capsules by mouth 2 (two) times daily.      . fish oil-omega-3 fatty acids 1000 MG capsule Take 3 g by mouth daily.      . Multiple Vitamin (MULTIVITAMIN) tablet Take 1 tablet by mouth daily.      . Potassium 99 MG TABS Take 99 mg by mouth  2 (two) times daily.      . simvastatin (ZOCOR) 80 MG tablet Take 40 mg by mouth at bedtime.          REVIEW OF SYSTEMS: Cardiovascular: No chest pain, chest pressure, palpitations, orthopnea, or dyspnea on exertion. No claudication or rest pain,  No history of DVT or phlebitis. Pulmonary: No productive cough, asthma or wheezing. Neurologic: No weakness, paresthesias, aphasia, or amaurosis. No dizziness. Hematologic: No bleeding problems or clotting disorders. Musculoskeletal: No joint pain or joint swelling. Gastrointestinal: No blood in stool or hematemesis Genitourinary: No dysuria or hematuria. Psychiatric:: No history of major depression. Integumentary: No rashes or ulcers. Constitutional: No fever or chills.  PHYSICAL EXAMINATION:   Vital signs are BP 125/79  Pulse 62  Resp 16  Ht 5' 10" (1.778 m)  Wt 210 lb (95.255 kg)  BMI 30.13 kg/m2  SpO2 96% General: The patient appears their stated age. HEENT:  No gross abnormalities Pulmonary:  Non labored breathing Abdomen: Soft and non-tender Musculoskeletal: There are no major deformities. Neurologic: No focal weakness or paresthesias are detected, Skin: There are no ulcer or   rashes noted. Psychiatric: The patient has normal affect. Cardiovascular: There is a regular rate and rhythm without significant murmur appreciated. Palpable posterior tibial pulses bilaterally left greater than right. No carotid bruit   Diagnostic Studies Ultrasound shows an increase in the size of his aneurysm it now measures 5.0 x 5.5  Assessment: Abdominal aortic aneurysm Plan: The patient will be scheduled for CT angiogram of the chest abdomen and pelvis to further violate the size of his aneurysm and to make sure he is still a candidate for endovascular repair. This will be scheduled within the next one to 2 weeks and further discussions were made based on the size of his aneurysm.  The patient is already undergone carotid ultrasound shows 1-39%  stenosis bilaterally  V. Wells Brabham IV, M.D. [facility] of [place] Office: [phone] Pager:  [phone]    

## 2011-10-29 NOTE — Anesthesia Procedure Notes (Signed)
Procedure Name: Intubation Date/Time: 10/29/2011 7:45 AM Performed by: Ellin Goodie Pre-anesthesia Checklist: Patient identified, Emergency Drugs available, Suction available, Timeout performed and Patient being monitored Patient Re-evaluated:Patient Re-evaluated prior to inductionOxygen Delivery Method: Circle System Utilized Preoxygenation: Pre-oxygenation with 100% oxygen Intubation Type: IV induction Ventilation: Mask ventilation without difficulty Laryngoscope Size: Mac and 3 Grade View: Grade II Tube type: Oral Tube size: 7.5 mm Number of attempts: 1 Airway Equipment and Method: stylet Secured at: 22 cm Tube secured with: Tape Dental Injury: Teeth and Oropharynx as per pre-operative assessment

## 2011-10-29 NOTE — Interval H&P Note (Signed)
History and Physical Interval Note:  10/29/2011 7:28 AM  Joseph Contreras  has presented today for surgery, with the diagnosis of Abdominal Aortic Aneurysm  The various methods of treatment have been discussed with the patient and family. After consideration of risks, benefits and other options for treatment, the patient has consented to  Procedure(s): ABDOMINAL AORTIC ENDOVASCULAR STENT GRAFT as a surgical intervention .  [name] history has been reviewed, patient examined, no change in status, stable for surgery.  I have reviewed the patients' chart and labs.  Questions were answered to the patient's satisfaction.     BRABHAM IV, Lala Lund  For EVAR today.

## 2011-10-29 NOTE — Anesthesia Preprocedure Evaluation (Addendum)
Anesthesia Evaluation  Patient identified by MRN, date of birth, ID band Patient awake    Reviewed: Allergy & Precautions, H&P , NPO status , Patient's Chart, lab work & pertinent test results, reviewed documented beta blocker date and time   Airway Mallampati: II TM Distance: <3 FB     Dental  (+) Teeth Intact and Dental Advisory Given   Pulmonary pneumonia ,  clear to auscultation        Cardiovascular hypertension, Pt. on medications +CHF Regular Normal    Neuro/Psych    GI/Hepatic   Endo/Other    Renal/GU      Musculoskeletal   Abdominal (+)  Abdomen: soft.    Peds  Hematology   Anesthesia Other Findings   Reproductive/Obstetrics                         Anesthesia Physical Anesthesia Plan  ASA: III  Anesthesia Plan: General ETT   Post-op Pain Management:    Induction:   Airway Management Planned:   Additional Equipment:   Intra-op Plan:   Post-operative Plan:   Informed Consent: I have reviewed the patients History and Physical, chart, labs and discussed the procedure including the risks, benefits and alternatives for the proposed anesthesia with the patient or authorized representative who has indicated his/her understanding and acceptance.     Plan Discussed with: CRNA and Surgeon  Anesthesia Plan Comments:         Anesthesia Quick Evaluation

## 2011-10-29 NOTE — Op Note (Signed)
[facility] [place]  Patient name: Joseph Contreras MRN: [id number] DOB: 1940/10/13 Sex: male  10/29/2011 Pre-operative Diagnosis: AAA   Post-operative diagnosis:  Same Surgeon:  Jorge Ny Assistants:  J.D. Vernie Murders Procedure:   Percutaneous endova anderal and scular aneurysm repair Anesthesia:  Gen Blood Loss:  See anesthesia record Specimens:  Nonand e  Findings:  Complete exclusion Devices Used:  Main body (primary left) GORE Excluder [id number], Contra right [employer] [id number]   Indications:   the patient presents today for endovascular aneurysm repair. He has been followed for many years for progressive enlargement of his aneurysm. It now measures 5.2 cm. 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 The risks and benefits were discussed with the patient and his preoperative visit.   Procedure:  The patient was identified in the holding area and taken to [facility] OR ROOM 09  The patient was then placed supine on the table. general anesthesia was administered.  The patient was prepped and draped in the usual sterile fashion.  A time out was called and antibiotics were administered.   ultrasound was used to evaluate both common femoral arteries. There was minimal calcification. Digital ultrasound images were acquired. An 11 blade was used to make a skin incision bilaterally. Both common femoral arteries were accessed under ultrasound guidance with an 18-gauge needle. An 035 wires were advanced and Proglide devices were deployed in the left o'clock and 1:00 position for precloasure. Next 8 French sheaths were placed bilaterally. The patient was fully heparinized. A contrast injection with a marked catheter up the left leg was performed to determine the appropriate length of the main body. A Gore Excluder 24 x 14 x 16 device was selected. This was prepared on the back table and then advanced up the left leg  over a Amplatz superstiff wire. An additional arteriogram was performed with a catheter up the right side. I targeted the left renal stent as the proximal portion of the repair. The device was deployed landing at this level. Next using a KM PA catheter and a Benson wire the contralateral gate was cannulated. A pigtail catheter was able to be freely rotated within the main body, confirming successful cannulation. A Amplatz superstiff wire was placed. I upsized the 8 [place] sheath to a 12 [place] sheath. I then advanced a q. 50 balloon and performed molding of the proximal portion of the device. A repeat angiogram was performed which showed the graft being good position with no proximal leak. Next the image intensifier was rotated to the left anterior oblique position a retrograde antrum was performed for the right groin which delineated the location of the right hypogastric artery. The right limb was then selected this was a Biomedical scientist 14 x 12. A dilator was placed back in the sheath and the sheath was advanced to the contralateral gate. Over the stiff wire the device was advanced through the sheath and the sheath withdrawn. I performed one more arteriogram to make sure the stent was in good position. It was then fully deployed. There was a cuff of ectatic common iliac artery distal to the stent which was done intentionally as I felt that treating that area would likely require coverage and embolization of his right internal iliac artery and with his large inferior mesenteric artery I did not want to do that in this setting. In addition the maximum diameter this area was  21mm. Next the remaining portion of the ipsilateral device was deployed. I then molded both limbs of the graft. A completion arteriogram was performed which shows continued patency of the right renal artery as well as both hypogastric arteries with complete exclusion of the aneurysm. This point the stiff wires were exchanged out for a 35 Bentson  wires. I then performed closure of each groin by securing the Proglide devices. Both groins were hemostatic. The patient was given 50 mg of protamine. During the administration of protamine pressure was held on the groin to. Once the protamine had been administered pressure was released the sutures were trimmed and the groins were closed with 4-0 Vicryl. Dermabond placed on the wound. The patient tolerated the procedure well there no complications   Disposition:  To PACU in stable condition.   Juleen China, M.D. [facility] [place] Office: [phone] Pager:  [phone]

## 2011-10-29 NOTE — Progress Notes (Signed)
[facility] [place]  Post-op EVAR- post-op check Date of Surgery: 10/29/2011 Surgeon: Surgeon(s): Seth Bake Durene Cal, MD Josephina Gip, MD POD: Day of Surgery  History of Present Illness  Joseph Contreras is a 71 y.o. male who is s/p EVAR. The patient denies back pain; denies abdominal pain; denies lower extremity pain.  IMAGING: Dg Chest Portable 1 View  10/29/2011  *RADIOLOGY REPORT*  Clinical Data: Line placement  PORTABLE CHEST - 1 VIEW  Comparison: Chest radiograph 10/27/2011  Findings: Interval placement of a right IJ sheath with tip in the mid SVC.  No pneumothorax.  Stable mildly enlarged heart silhouette.  There is mild central venous pulmonary congestion.  IMPRESSION: Interval placement right central venous line without complication.  Mild central venous pulmonary congestion.  Original Report Authenticated By: Genevive Bi, M.D.   Dg Abd 2 Views  10/29/2011  *RADIOLOGY REPORT*  Clinical Data: AAA stent insertion  ABDOMEN - 2 VIEW  Comparison: CT abdomen pelvis of 10/18/2011  Findings: C-arm spot films show images from aortogram and placement of AAA stent.  IMPRESSION: Placement of AAA stent.  Original Report Authenticated By: Juline Patch, M.D.   Dg Abd Portable 1v  10/29/2011  *RADIOLOGY REPORT*  Clinical Data: Postop AAA stent placement  PORTABLE ABDOMEN - 1 VIEW  Comparison: CT angio abdomen pelvis of 10/18/2011  Findings: The bowel gas pattern is nonspecific.  A AAA stent is present with a question of slight waisting proximally.  Contrast is noted being excreted by the right kidney.  No contrast is seen been excreted by the left kidney which by recent CT appears to be atrophic.  There are degenerative changes in the lower lumbar spine.  IMPRESSION: AAA stent present.  Nonspecific bowel gas pattern.  Original Report Authenticated By: Juline Patch, M.D.    Significant Diagnostic Studies: CBC Lab Results  Component Value Date   WBC 6.7 10/29/2011   HGB 12.6*  10/29/2011   HCT 37.3* 10/29/2011   MCV 95.9 10/29/2011   PLT 143* 10/29/2011     BMET    Component Value Date/Time   NA 140 10/29/2011 1030   K 3.9 10/29/2011 1030   CL 109 10/29/2011 1030   CO2 25 10/29/2011 1030   GLUCOSE 100* 10/29/2011 1030   BUN 12 10/29/2011 1030   CREATININE 0.99 10/29/2011 1030   CALCIUM 8.8 10/29/2011 1030   GFRNONAA 82* 10/29/2011 1030   GFRAA >90 10/29/2011 1030    COAG Lab Results  Component Value Date   INR 1.07 10/29/2011   INR 0.93 10/27/2011   No results found for this basename: PTT    Physical Examination  BP Readings from Last 3 Encounters:  10/29/11 144/71  10/29/11 144/71  10/27/11 127/83   Temp Readings from Last 3 Encounters:  10/29/11 97.3 F (36.3 C) Oral  10/29/11 97.3 F (36.3 C) Oral  10/27/11 97.1 F (36.2 C)    SpO2 Readings from Last 3 Encounters:  10/29/11 95%  10/29/11 95%  10/27/11 96%   Pulse Readings from Last 3 Encounters:  10/29/11 63  10/29/11 63  10/27/11 66    General: A&O x 3, WDWN male in NAD Pulmonary: normal non-labored breathing  Cardiac: RRR Abdomen: soft, NT, NABS Bilateral groin wounds: clean, dry, intact, without hematoma  Extremities without ischemic changes, no Gangrene , no cellulitis; no open wounds;   Neurologic: A&O X 3; Appropriate Affect  Assessment: Joseph Contreras is a 71 y.o. male who is Day of Surgery  EVAR.  Pt is doing well with no complaints  Plan: Home in am if stable   surveillance of the endograft was discussed with the patient  A CTA of abdomen and pelvis will be scheduled for one month to assess for endoleak.  The patient will follow up with [place] in one month with these studies.   SignedMarlowe Shores [phone] 10/29/2011 4:31 PM.

## 2011-10-29 NOTE — OR Nursing (Signed)
Endovascular fluoroscopy time 9 minutes and 22 seconds.

## 2011-10-30 LAB — CBC
Hemoglobin: 12.2 g/dL — ABNORMAL LOW (ref 13.0–17.0)
MCH: 32.3 pg (ref 26.0–34.0)
MCHC: 34 g/dL (ref 30.0–36.0)
MCV: 95 fL (ref 78.0–100.0)
Platelets: 159 10*3/uL (ref 150–400)
RBC: 3.78 MIL/uL — ABNORMAL LOW (ref 4.22–5.81)

## 2011-10-30 LAB — BASIC METABOLIC PANEL
CO2: 24 mEq/L (ref 19–32)
Calcium: 9.1 mg/dL (ref 8.4–10.5)
Creatinine, Ser: 0.92 mg/dL (ref 0.50–1.35)
GFR calc non Af Amer: 85 mL/min — ABNORMAL LOW (ref >90)
Glucose, Bld: 144 mg/dL — ABNORMAL HIGH (ref 70–99)

## 2011-10-30 NOTE — Discharge Summary (Signed)
Agree with above,  Follow up in 1 month with CTA abd/pelvis  Durene Cal

## 2011-10-30 NOTE — Progress Notes (Signed)
[facility] [place]  Subjective  - POD #1 EVAR doing well  Objective 141/69 74 98.9 F (37.2 C) (Oral) 27 93%  Intake/Output Summary (Last 24 hours) at 10/30/11 0944 Last data filed at 10/30/11 0900  Gross per 24 hour  Intake 2793.33 ml  Output   2450 ml  Net 343.33 ml   No hematoma in groin Feet warm Has voided and eaten without difficulty  Assessment/Planning: Doing well post EVAR D.c home  Follow up [place]  FIELDS,CHARLES E 10/30/2011 9:44 AM --  Laboratory Lab Results:  Basename 10/30/11 0623 10/29/11 1030  WBC 10.9* 6.7  HGB 12.2* 12.6*  HCT 35.9* 37.3*  PLT 159 143*   BMET  Basename 10/30/11 0623 10/29/11 1030  NA 138 140  K 3.7 3.9  CL 105 109  CO2 24 25  GLUCOSE 144* 100*  BUN 8 12  CREATININE 0.92 0.99  CALCIUM 9.1 8.8    COAG Lab Results  Component Value Date   INR 1.07 10/29/2011   INR 0.93 10/27/2011   No results found for this basename: PTT    Antibiotics Anti-infectives     Start     Dose/Rate Route Frequency Ordered Stop   10/29/11 1900   cefUROXime (ZINACEF) 1.5 g in dextrose 5 % 50 mL IVPB        1.5 g 100 mL/hr over 30 Minutes Intravenous Every 12 hours 10/29/11 1432 10/30/11 0723   10/28/11 1500   cefUROXime (ZINACEF) 1.5 g in dextrose 5 % 50 mL IVPB        1.5 g 100 mL/hr over 30 Minutes Intravenous  Once 10/28/11 1450 10/29/11 0747

## 2011-10-30 NOTE — Plan of Care (Signed)
Problem: Phase I Progression Outcomes Goal: If Diabetic, blood sugar < 150 Outcome: Not Met (add Reason) Not diabetic

## 2011-10-30 NOTE — Discharge Summary (Signed)
[facility] Discharge Summary   Patient ID:  Joseph Contreras MRN: [id number] DOB/AGE: 1941-05-24 71 y.o.  Admit date: 10/29/2011 Discharge date: 10/30/2011 Date of Surgery: 10/29/2011 Surgeon: Surgeon(s): Seth Bake Durene Cal, MD Josephina Gip, MD  Admission Diagnosis: Abdominal Aortic Aneurysm  Discharge Diagnoses:  Abdominal Aortic Aneurysm  Secondary Diagnoses: Past Medical History  Diagnosis Date  . Hypertension   . Hypercholesterolemia   . Gout   . AAA (abdominal aortic aneurysm) 10/11/11  . Congestive heart failure 95  . Pneumonia 95  . Cancer     melanoma, squesmous cell hand, back  . Kidney atrophy     lft  . Gout     Procedure(s): ABDOMINAL AORTIC ENDOVASCULAR STENT GRAFT  Discharged Condition: good  HPI:  Joseph Contreras has presented today for surgery, with the diagnosis of Abdominal Aortic Aneurysm The various methods of treatment have been discussed with the patient and family. After consideration of risks, benefits and other options for treatment, the patient has consented to Procedure(s):  ABDOMINAL AORTIC ENDOVASCULAR STENT GRAFT as a surgical intervention . [name] history has been reviewed, patient examined, no change in status, stable for surgery. I have reviewed the patients' chart and labs. Questions were answered to the patient's satisfaction.    Hospital Course:  Joseph Contreras is a 71 y.o. male is S/P   Procedure(s): ABDOMINAL AORTIC ENDOVASCULAR STENT GRAFT Extubated: POD # 0 Post-op wounds healing well Pt. Ambulating, voiding and taking PO diet without difficulty. Pt pain controlled with PO pain meds. Labs as below Complications:none  Consults:     Significant Diagnostic Studies: CBC Lab Results  Component Value Date   WBC 10.9* 10/30/2011   HGB 12.2* 10/30/2011   HCT 35.9* 10/30/2011   MCV 95.0 10/30/2011   PLT 159 10/30/2011    BMET    Component Value Date/Time   NA 138 10/30/2011 0623   K 3.7 10/30/2011 0623   CL 105  10/30/2011 0623   CO2 24 10/30/2011 0623   GLUCOSE 144* 10/30/2011 0623   BUN 8 10/30/2011 0623   CREATININE 0.92 10/30/2011 0623   CALCIUM 9.1 10/30/2011 0623   GFRNONAA 85* 10/30/2011 0623   GFRAA >90 10/30/2011 0623   COAG Lab Results  Component Value Date   INR 1.07 10/29/2011   INR 0.93 10/27/2011     Disposition:  Discharge to :Home Discharge Orders    Future Appointments: Provider: Department: Dept Phone: Center:   12/06/2011 2:00 PM [facility] [phone] [facility]   12/06/2011 3:15 PM V Durene Cal, MD [facility] [phone] [facility]     Future Orders Please Complete By Expires   Resume previous diet      Driving Restrictions      Comments:   No driving for 2 weeks   Lifting restrictions      Comments:   No lifting for 4 weeks   Call MD for:  temperature >100.5      Call MD for:  redness, tenderness, or signs of infection (pain, swelling, bleeding, redness, odor or green/yellow discharge around incision site)      Call MD for:  severe or increased pain, loss or decreased feeling  in affected limb(s)      Increase activity slowly      Comments:   Walk with assistance use walker or cane as needed   May shower       Scheduling Instructions:   sunday   ABDOMINAL PROCEDURE/ANEURYSM REPAIR/AORTO-BIFEMORAL BYPASS:  Call MD for increased abdominal pain; cramping diarrhea; nausea/vomiting      Remove dressing in 48 hours         Joseph Contreras, Joseph Contreras  Home Medication Instructions [id number]   Printed on:10/30/11 1039  Medication Information                    benazepril (LOTENSIN) 20 MG tablet Take 20 mg by mouth daily. 1/2 tab daily           simvastatin (ZOCOR) 80 MG tablet Take 80 mg by mouth at bedtime. 1/2 daily           Multiple Vitamin (MULTIVITAMIN) tablet Take 1 tablet by mouth daily.           aspirin 325 MG tablet Take 325 mg by mouth daily.           fish oil-omega-3 fatty acids 1000 MG capsule Take 3 g by mouth daily.           Potassium 99  MG TABS Take 99 mg by mouth 2 (two) times daily.            Cholecalciferol (VITAMIN D3) 1000 UNITS CAPS Take 2,000 capsules by mouth daily.            bumetanide (BUMEX) 0.5 MG tablet Take 0.5 mg by mouth daily.            Verbal and written Discharge instructions given to the patient. Wound care per Discharge AVS Follow-up Information    Follow up with Myra Gianotti IV, Lala Lund, MD in 4 weeks. (office will arrange - sent)    Contact information:   [address] [place] [address] [phone]          Signed: Marlowe Shores 10/30/2011, 10:39 AM

## 2011-10-31 LAB — TYPE AND SCREEN: Unit division: 0

## 2011-12-03 ENCOUNTER — Encounter: Payer: Self-pay | Admitting: Surgery

## 2011-12-06 ENCOUNTER — Ambulatory Visit (INDEPENDENT_AMBULATORY_CARE_PROVIDER_SITE_OTHER): Payer: Medicare Other | Admitting: Surgery

## 2011-12-06 ENCOUNTER — Ambulatory Visit
Admit: 2011-12-06 | Discharge: 2011-12-06 | Disposition: A | Discharge status: Home / Self Care | Payer: Medicare Other | Attending: Thoracic Diseases | Admitting: Thoracic Diseases

## 2011-12-06 ENCOUNTER — Encounter: Payer: Self-pay | Admitting: Surgery

## 2011-12-06 VITALS — BP 140/74 | HR 58 | Resp 16 | Ht 70.0 in | Wt 210.0 lb

## 2011-12-06 DIAGNOSIS — I714 Abdominal aortic aneurysm, without rupture: Secondary | ICD-10-CM

## 2011-12-06 DIAGNOSIS — Z5189 Encounter for other specified aftercare: Secondary | ICD-10-CM | POA: Diagnosis not present

## 2011-12-06 MED ORDER — IOHEXOL 350 MG/ML SOLN
100.0000 mL | Freq: Once | INTRAVENOUS | Status: AC | PRN
  Administered 2011-12-06: 100 mL via INTRAVENOUS

## 2011-12-06 NOTE — Progress Notes (Signed)
The patient is back today for followup. He is status post endovascular aneurysm repair on 10/29/2011. This was done percutaneously. His postoperative course was uncomplicated he is back today without complaints. He has had a bulge in his left groin which is significantly improved. There was some drainage from this but none recently.  I have reviewed his CT angiogram this shows that the stent graft is in good position and that there is no evidence of endoleak. I do not see any evidence of pseudoaneurysm or problems within the left groin.  On examination he has palpable posterior tibial pulses bilaterally the left groin area looks like a residual stitch abscess which is spontaneously resolved.  I will plan on having him come back to see me in 6 months with an abdominal ultrasound. I reviewed his carotid duplex from recent past they show 1-39% stenosis bilaterally

## 2012-01-03 DIAGNOSIS — I714 Abdominal aortic aneurysm, without rupture: Secondary | ICD-10-CM | POA: Diagnosis not present

## 2012-01-03 DIAGNOSIS — I701 Atherosclerosis of renal artery: Secondary | ICD-10-CM | POA: Diagnosis not present

## 2012-04-12 DIAGNOSIS — I714 Abdominal aortic aneurysm, without rupture: Secondary | ICD-10-CM | POA: Diagnosis not present

## 2012-04-12 DIAGNOSIS — E782 Mixed hyperlipidemia: Secondary | ICD-10-CM | POA: Diagnosis not present

## 2012-06-01 IMAGING — CT CT CTA ABD/PEL W/CM AND/OR W/O CM
1 of 4 series · 14 of 32 positions shown, 18 images · IV contrast (omnipaque)
Comparison: 10/19/2010 and 10/18/2011

CLINICAL DATA: Evaluate abdominal aortic stent graft.

CT ANGIOGRAPHY ABDOMEN AND PELVIS
TECHNIQUE: Multidetector CT imaging of the abdomen and pelvis was
performed using the standard protocol during bolus administration
of intravenous contrast.  Multiplanar reconstructed images
including MIPs were obtained and reviewed to evaluate the vascular
anatomy.
Contrast: 100mL OMNIPAQUE IOHEXOL 350 MG/ML IV SOLN

[Series 5: angio · axial · 0.94mm/px · z∈[-408,+16]mm · 14 of 239 slices shown, 18 images]
[im 12/239  soft-tissue]
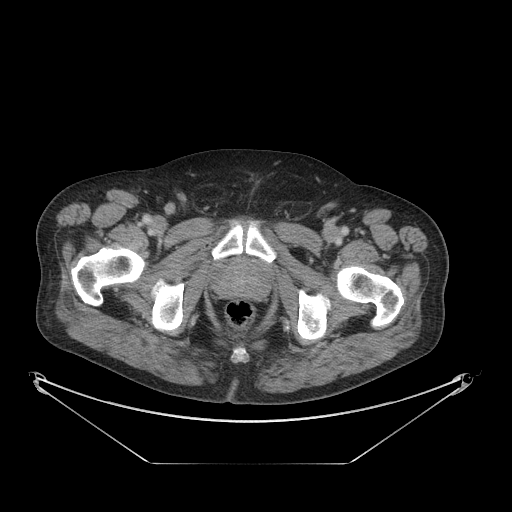
[im 12/239  bone]
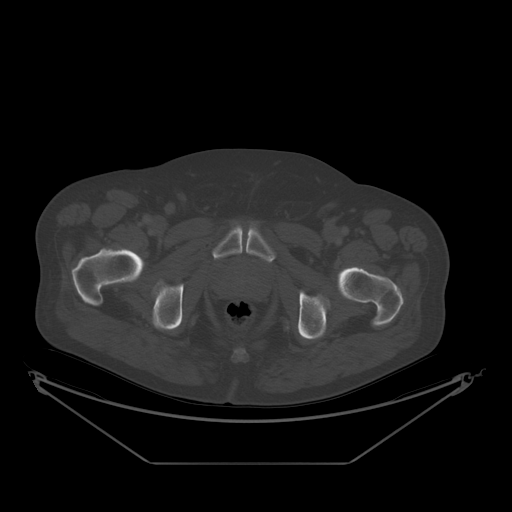
[im 35/239  soft-tissue]
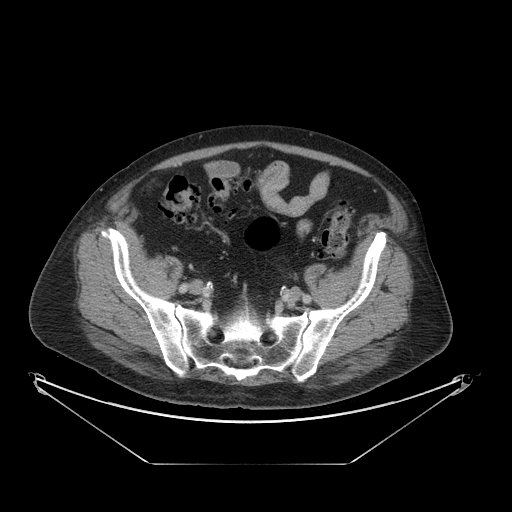
[im 57/239  soft-tissue]
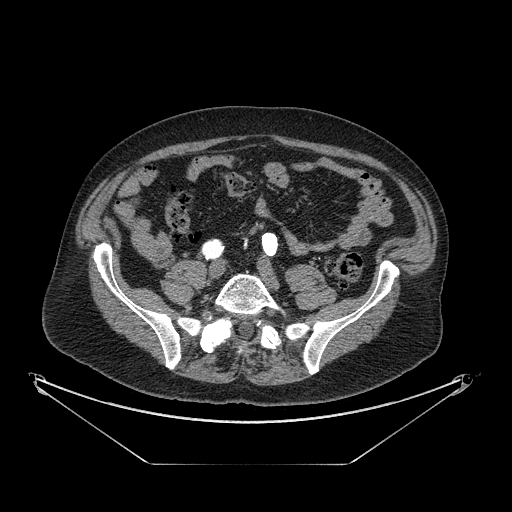
[im 69/239  soft-tissue]
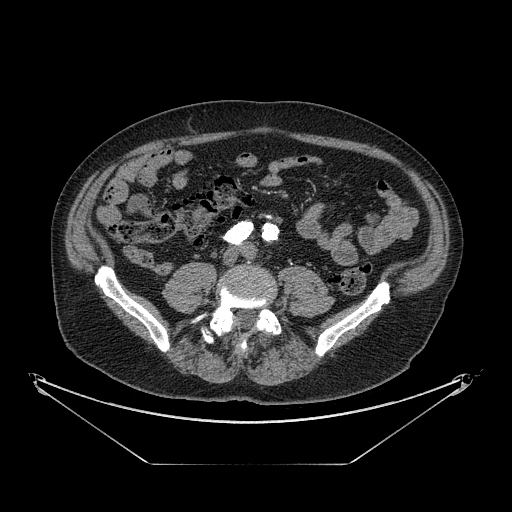
[im 91/239  soft-tissue]
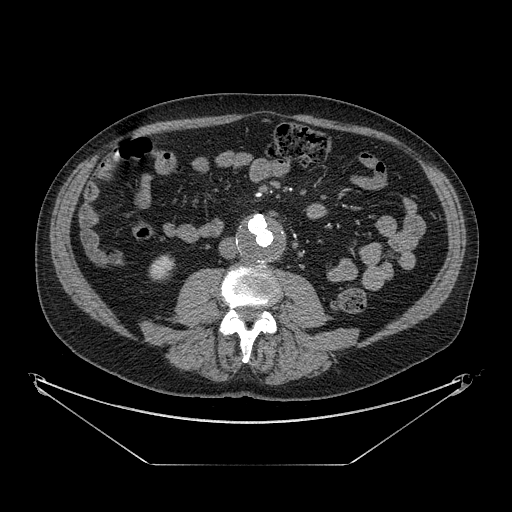
[im 114/239  soft-tissue]
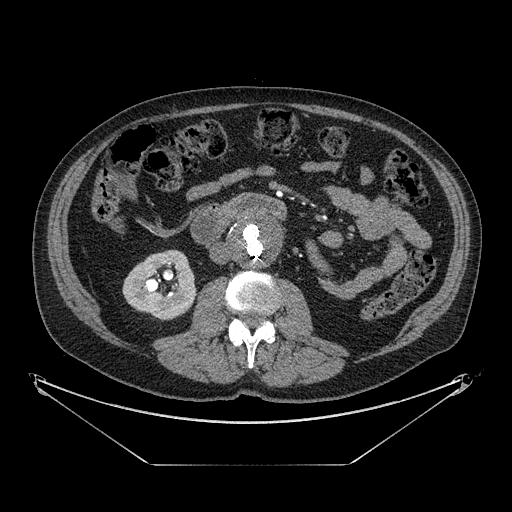
[im 125/239  soft-tissue]
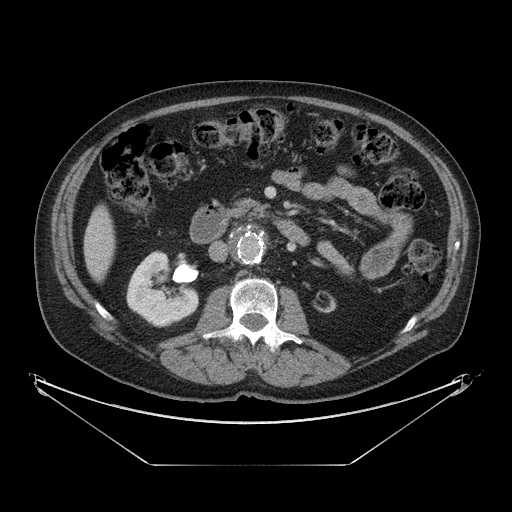
[im 148/239  soft-tissue]
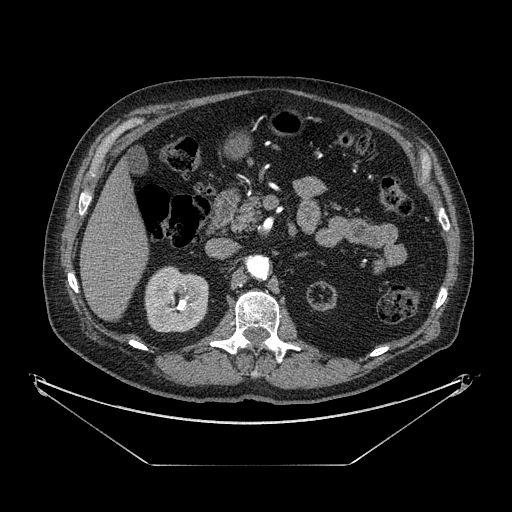
[im 171/239  soft-tissue]
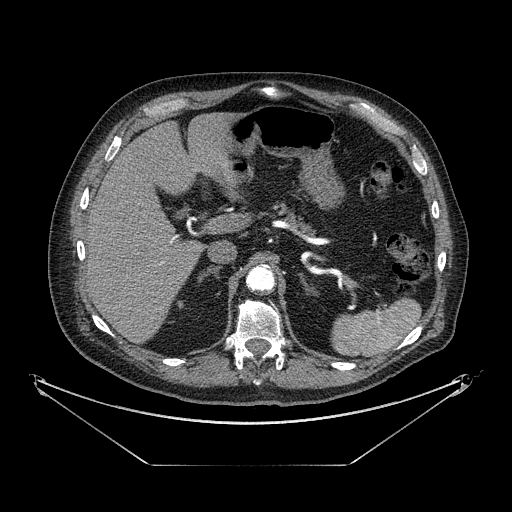
[im 171/239  bone]
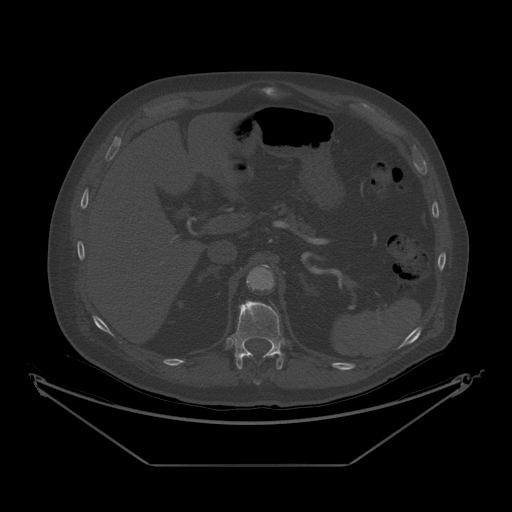
[im 182/239  soft-tissue]
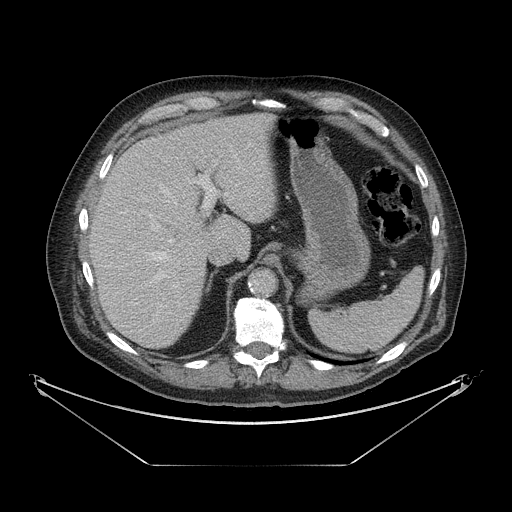
[im 193/239  lung]
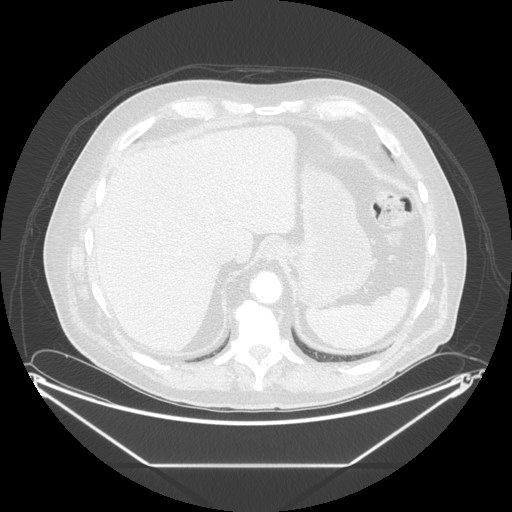
[im 205/239  soft-tissue]
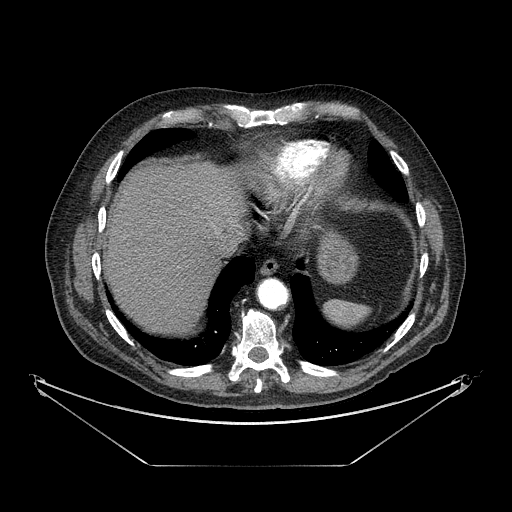
[im 205/239  lung]
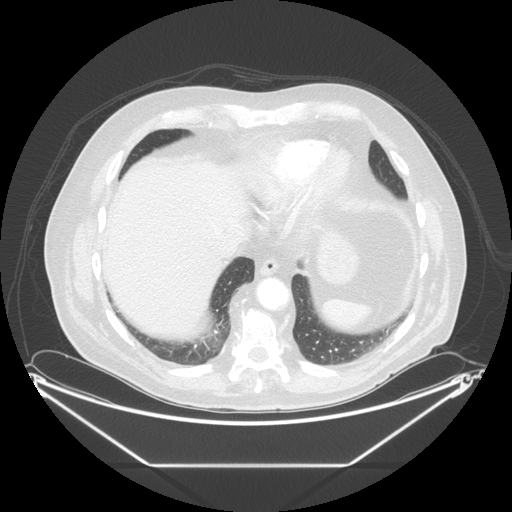
[im 216/239  lung]
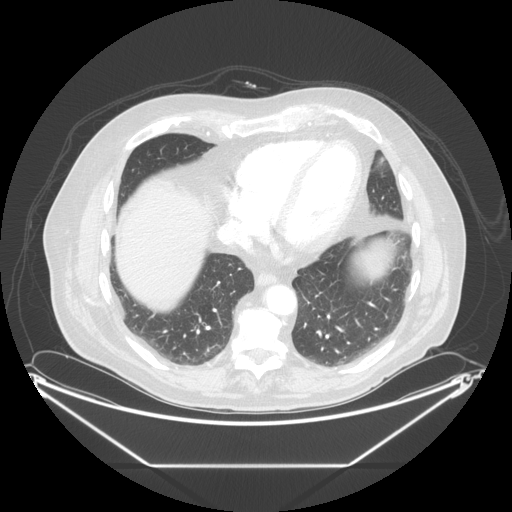
[im 227/239  soft-tissue]
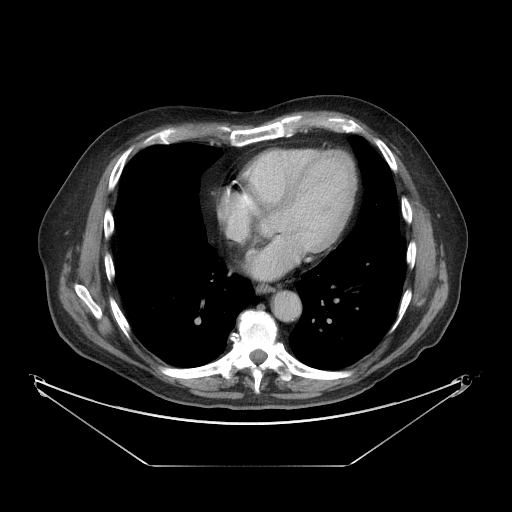
[im 227/239  lung]
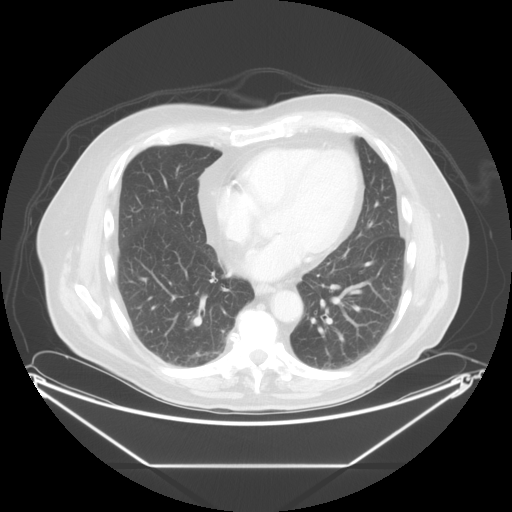

[14 of 32 positions shown; findings below may reference images not displayed]

FINDINGS: Vascular structures: The patient now has an aortic stent graft that
starts at the level of the left renal artery stent and extends into
the common iliac arteries bilaterally.  The stent graft is patent.
There is no evidence for an endoleak.  There is flow within the
inferior mesenteric artery but no evidence for flow at the IMA
origin.  The patient has a known occlusion of the left renal
artery.  The right renal artery is patent.  The celiac trunk and
SMA are patent.  The patient has an aberrant hepatic anatomy with
the common hepatic artery coming off the superior mesenteric
artery.  There is flow within the internal and external iliac
arteries bilaterally.

The aneurysm sac measures 5.1 x 5.7 cm on sequence 5, image 85 and
previously measured 5.6 x 5.3 cm at a similar level. Again noted is
aneurysmal dilatation of the distal right common iliac artery.
This area roughly measures 2.2 cm, previously measured 2.3 cm.

Nonvascular structures:  Lung bases are clear.  No evidence for
free air.  Chronic atrophy of the left kidney related to renal
artery stenosis.  Again seen is prominent fat in the cardiac
interatrial septum and there could be some lipomatous hypertrophy
in this area.  No gross abnormality involving the liver,
gallbladder, portal venous system, spleen, pancreas, adrenal glands
or right kidney.  No gross abnormality of the prostate, urinary
bladder or bowel structures.  The patient does have extensive
colonic diverticulosis but no evidence for acute colonic
inflammation.  No significant free fluid or lymphadenopathy.
Stable sclerotic area in the right iliac bone.  No acute bony
abnormality.  Again noted is a left-sided pars defect at L5.

 Review of the MIP images confirms the above findings.
IMPRESSION: Patent abdominal aortic stent graft without an endoleak.  Minimal
change in the size of the aneurysm sac.

Stable aneurysmal dilatation of the distal right common iliac
artery.  This area roughly measures 2.2 cm.

No acute findings within the abdomen or pelvis.

## 2012-06-02 ENCOUNTER — Encounter: Payer: Self-pay | Admitting: Surgery

## 2012-06-05 ENCOUNTER — Encounter: Payer: Self-pay | Admitting: Surgery

## 2012-06-05 ENCOUNTER — Encounter (INDEPENDENT_AMBULATORY_CARE_PROVIDER_SITE_OTHER): Payer: Medicare Other | Admitting: *Deleted

## 2012-06-05 ENCOUNTER — Ambulatory Visit (INDEPENDENT_AMBULATORY_CARE_PROVIDER_SITE_OTHER): Payer: Medicare Other | Admitting: Surgery

## 2012-06-05 VITALS — BP 134/74 | HR 55 | Resp 16 | Ht 70.0 in | Wt 210.0 lb

## 2012-06-05 DIAGNOSIS — I714 Abdominal aortic aneurysm, without rupture, unspecified: Secondary | ICD-10-CM

## 2012-06-05 DIAGNOSIS — Z48812 Encounter for surgical aftercare following surgery on the circulatory system: Secondary | ICD-10-CM

## 2012-06-05 NOTE — Addendum Note (Signed)
Addended by: Sharee Pimple on: 06/05/2012 10:14 AM   Modules accepted: Orders

## 2012-06-05 NOTE — Progress Notes (Signed)
[facility] [place]   Patient name: Joseph Contreras MRN: [id number] DOB: 10/13/1940 Sex: male     Chief Complaint  Patient presents with  . AAA    6 month f/u EVAR 10/29/2011    HISTORY OF PRESENT ILLNESS: The patient is here today for followup. He is status post endovascular aneurysm repair on 11/18/2011. His initial CT scan showed complete exclusion of the aneurysm without evidence of endoleak. He has had no complaints since i llast saw him.  Past Medical History  Diagnosis Date  . Hypertension   . Hypercholesterolemia   . Gout   . Congestive heart failure 95  . Pneumonia 95  . Cancer     melanoma, squesmous cell hand, back  . Kidney atrophy     lft  . Gout   . AAA (abdominal aortic aneurysm) 10/11/11  . AAA (abdominal aortic aneurysm) 10/29/11    Past Surgical History  Procedure Date  . Left renal stent     one kidney works rt side  . Cardiac catheterization   . Vasectomy   . Ganglion cyst excision 81    rt wrist  . Tonsillectomy 51  . Eye surgery 2.5.13    retinal detachment      History   Social History  . Marital Status: Married    Spouse Name: N/A    Number of Children: N/A  . Years of Education: N/A   Occupational History  . Not on file.   Social History Main Topics  . Smoking status: Former Smoker -- 1.0 packs/day    Quit date: 10/05/1966  . Smokeless tobacco: Not on file  . Alcohol Use: No  . Drug Use: No  . Sexually Active: Not on file   Other Topics Concern  . Not on file   Social History Narrative  . No narrative on file    Family History  Problem Relation Age of Onset  . Heart disease Father     Allergies as of 06/05/2012 - Review Complete 06/05/2012  Allergen Reaction Noted  . Codeine Nausea And Vomiting 10/06/2011    Current Outpatient Prescriptions on File Prior to Visit  Medication Sig Dispense Refill  . aspirin 325 MG tablet Take 325 mg by mouth daily.      . benazepril (LOTENSIN) 20 MG tablet Take  20 mg by mouth daily. 1/2 tab daily      . bumetanide (BUMEX) 0.5 MG tablet Take 0.5 mg by mouth daily.      . Cholecalciferol (VITAMIN D3) 1000 UNITS CAPS Take 2,000 capsules by mouth daily.       . fish oil-omega-3 fatty acids 1000 MG capsule Take 3 g by mouth daily.      . Multiple Vitamin (MULTIVITAMIN) tablet Take 1 tablet by mouth daily.      . Potassium 99 MG TABS Take 99 mg by mouth 2 (two) times daily.       . simvastatin (ZOCOR) 80 MG tablet Take 20 mg by mouth at bedtime.          REVIEW OF SYSTEMS: No change since prior visit  PHYSICAL EXAMINATION:   Vital signs are BP 134/74  Pulse 55  Resp 16  Ht 5\' 10"  (1.778 m)  Wt 210 lb (95.255 kg)  BMI 30.13 kg/m2  SpO2 99% General: The patient appears their stated age. HEENT:  No gross abnormalities Pulmonary:  Non labored breathing Abdomen: Soft and non-tender diastases recti Musculoskeletal: There are no major deformities. Neurologic:  No focal weakness or paresthesias are detected, Skin: There are no ulcer or rashes noted. Psychiatric: The patient has normal affect. Cardiovascular: Palpable femoral pulses   Diagnostic Studies I have ordered and reviewed his ultrasound. This shows no evidence of endoleak. The aneurysm now measures 4.5 x 4.9 His carotid study from preop revealed 1-39% stenosis bilaterally  Assessment: Status post endovascular aneurysm repair Plan: The patient will come back in 6 months for an abdominal ultrasound.  Jorge Ny, M.D. [facility] [place] Office: [phone] Pager:  [phone]

## 2012-06-26 DIAGNOSIS — Z23 Encounter for immunization: Secondary | ICD-10-CM | POA: Diagnosis not present

## 2012-10-02 DIAGNOSIS — I10 Essential (primary) hypertension: Secondary | ICD-10-CM | POA: Diagnosis not present

## 2012-10-02 DIAGNOSIS — I719 Aortic aneurysm of unspecified site, without rupture: Secondary | ICD-10-CM | POA: Diagnosis not present

## 2012-10-02 DIAGNOSIS — I701 Atherosclerosis of renal artery: Secondary | ICD-10-CM | POA: Diagnosis not present

## 2012-10-02 DIAGNOSIS — E782 Mixed hyperlipidemia: Secondary | ICD-10-CM | POA: Diagnosis not present

## 2012-10-25 ENCOUNTER — Encounter: Payer: Self-pay | Admitting: Pharmacist Clinician (PhC)/ Clinical Pharmacy Specialist

## 2012-10-27 DIAGNOSIS — Z79899 Other long term (current) drug therapy: Secondary | ICD-10-CM | POA: Diagnosis not present

## 2012-12-04 ENCOUNTER — Ambulatory Visit: Payer: Medicare Other | Admitting: Surgery

## 2013-01-19 ENCOUNTER — Encounter: Payer: Self-pay | Admitting: Surgery

## 2013-01-22 ENCOUNTER — Ambulatory Visit: Payer: Medicare Other | Admitting: Surgery

## 2013-01-22 ENCOUNTER — Encounter (INDEPENDENT_AMBULATORY_CARE_PROVIDER_SITE_OTHER): Payer: Medicare Other

## 2013-01-22 ENCOUNTER — Ambulatory Visit (INDEPENDENT_AMBULATORY_CARE_PROVIDER_SITE_OTHER): Payer: Medicare Other | Admitting: Surgery

## 2013-01-22 ENCOUNTER — Encounter: Payer: Self-pay | Admitting: Surgery

## 2013-01-22 VITALS — BP 134/71 | HR 64 | Ht 70.0 in | Wt 214.8 lb

## 2013-01-22 DIAGNOSIS — Z48812 Encounter for surgical aftercare following surgery on the circulatory system: Secondary | ICD-10-CM | POA: Diagnosis not present

## 2013-01-22 DIAGNOSIS — I714 Abdominal aortic aneurysm, without rupture: Secondary | ICD-10-CM

## 2013-01-22 NOTE — Progress Notes (Signed)
[facility] [place]   Patient name: Joseph Contreras MRN: [id number] DOB: [birth year] Sex: male     Chief Complaint  Patient presents with  . AAA    6 month f/u - s/p EVAR 10/29/2011 - pt has no complaints    HISTORY OF PRESENT ILLNESS: The patient is here today for followup. He is status post endovascular aneurysm repair on 11/18/2011. His initial CT scan showed complete exclusion of the aneurysm without evidence of endoleak. He has no complaints today   Past Medical History  Diagnosis Date  . Hypertension 02/25/1995    echo - mod L ventricular hypertrophy w/ norm LV function  . Hypercholesterolemia   . Gout   . Congestive heart failure 95  . Pneumonia 95  . Cancer     melanoma, squesmous cell hand, back  . Kidney atrophy     left  . Gout   . AAA (abdominal aortic aneurysm) 10/11/11    stress test - EF 59%, no evidence of ischemia or infarct  . AAA (abdominal aortic aneurysm) 10/29/11    endovascular stent graft  . 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 AAA (abdominal aortic aneurysm) 07/05/2011    doppler infrarenal aorta, transverse diameter 4.7 x 4.6cm    Past Surgical History  Procedure Laterality Date  . Left renal stent  1996    one kidney works rt side  . Cardiac catheterization  1995    95% L RAS, severe cardiomyopathy EF 26%  . Vasectomy  09/1988  . Ganglion cyst excision  79    rt wrist  . Tonsillectomy  1949  . Eye surgery  2.5.13    retinal detachment      History   Social History  . Marital Status: Married    Spouse Name: N/A    Number of Children: N/A  . Years of Education: N/A   Occupational History  . Not on file.   Social History Main Topics  . Smoking status: Former Smoker -- 1.00 packs/day    Quit date: 10/05/1966  . Smokeless tobacco: Not on file  . Alcohol Use: No  . Drug Use: No  . Sexually Active: Not on file   Other Topics Concern   . Not on file   Social History Narrative  . No narrative on file    Family History  Problem Relation Age of Onset  . Heart disease Father     Allergies as of 01/22/2013 - Review Complete 01/22/2013  Allergen Reaction Noted  . Codeine Nausea And Vomiting 10/06/2011    Current Outpatient Prescriptions on File Prior to Visit  Medication Sig Dispense Refill  . aspirin 325 MG tablet Take 325 mg by mouth daily.      . benazepril (LOTENSIN) 20 MG tablet Take 20 mg by mouth daily. 1/2 tab daily      . bumetanide (BUMEX) 0.5 MG tablet Take 0.5 mg by mouth daily.      . Cholecalciferol (VITAMIN D3) 1000 UNITS CAPS Take 2,000 capsules by mouth daily.       . fish oil-omega-3 fatty acids 1000 MG capsule Take 3 g by mouth daily.      . Multiple Vitamin (MULTIVITAMIN) tablet Take 1 tablet by mouth daily.      Marland Kitchen NIACIN PO Take by mouth daily.      . Potassium 99 MG TABS Take 99 mg by mouth 2 (two) times  daily.       . simvastatin (ZOCOR) 80 MG tablet Take 20 mg by mouth at bedtime.        No current facility-administered medications on file prior to visit.     REVIEW OF SYSTEMS: All systems are negative as documented by the patient in the encounter form  PHYSICAL EXAMINATION:   Vital signs are BP 134/71  Pulse 64  Ht 5\' 10"  (1.778 m)  Wt 214 lb 12.8 oz (97.433 kg)  BMI 30.82 kg/m2  SpO2 96% General: The patient appears their stated age. HEENT:  No gross abnormalities Pulmonary:  Non labored breathing Abdomen: Soft and non-tender Musculoskeletal: There are no major deformities. Neurologic: No focal weakness or paresthesias are detected, Skin: There are no ulcer or rashes noted. Psychiatric: The patient has normal affect. Cardiovascular: There is a regular rate and rhythm without significant murmur appreciated. No carotid bruits   Diagnostic Studies Duplex ultrasound was performed today. This shows continued decrease in the size of the aneurysm. It measures 4.3 x 4.6 today.  There was a question of a small type II leak inferiorly and to the left  Assessment: Status post endovascular aneurysm repair Plan: The patient continues to do very well. On today's ultrasound there was the possibility of a small type II endoleak. This was not seen on his previous CT scan. Because the diameter of his aneurysm sac continues to decrease, I am not going to confirm this finding with a repeat CT scan. Rather, I will have him come back in 6 months for repeat ultrasound. Findings were discussed with the patient.  Jorge Ny, M.D. [facility] [place] Office: [phone] Pager:  [phone]

## 2013-01-22 NOTE — Addendum Note (Signed)
Addended by: Sharee Pimple on: 01/22/2013 02:02 PM   Modules accepted: Orders

## 2013-04-09 ENCOUNTER — Ambulatory Visit (INDEPENDENT_AMBULATORY_CARE_PROVIDER_SITE_OTHER): Payer: Medicare Other | Admitting: Cardiology

## 2013-04-09 ENCOUNTER — Encounter: Payer: Self-pay | Admitting: Cardiology

## 2013-04-09 VITALS — BP 124/78 | HR 65 | Ht 69.0 in | Wt 217.6 lb

## 2013-04-09 DIAGNOSIS — M109 Gout, unspecified: Secondary | ICD-10-CM | POA: Diagnosis not present

## 2013-04-09 DIAGNOSIS — IMO0001 Reserved for inherently not codable concepts without codable children: Secondary | ICD-10-CM | POA: Insufficient documentation

## 2013-04-09 DIAGNOSIS — E7800 Pure hypercholesterolemia, unspecified: Secondary | ICD-10-CM

## 2013-04-09 DIAGNOSIS — I701 Atherosclerosis of renal artery: Secondary | ICD-10-CM

## 2013-04-09 DIAGNOSIS — R319 Hematuria, unspecified: Secondary | ICD-10-CM | POA: Diagnosis not present

## 2013-04-09 DIAGNOSIS — I714 Abdominal aortic aneurysm, without rupture: Secondary | ICD-10-CM | POA: Diagnosis not present

## 2013-04-09 DIAGNOSIS — Z0389 Encounter for observation for other suspected diseases and conditions ruled out: Secondary | ICD-10-CM

## 2013-04-09 DIAGNOSIS — Z77098 Contact with and (suspected) exposure to other hazardous, chiefly nonmedicinal, chemicals: Secondary | ICD-10-CM | POA: Insufficient documentation

## 2013-04-09 NOTE — Progress Notes (Signed)
04/09/2013 Joseph Contreras   1940/10/04  [id number]  Primary Physicia Lucila Maine, MD Primary Cardiologist: Dr Allyson Sabal  HPI:  The patient is a very pleasant 72 year old mildly overweight married Caucasian male, father of 3 and grandfather of 5 grandchildren. He is a Product/process development scientist and is followed by the [employer]. He has a history of Agent Orange exposure. Dr Allyson Sabal performed PTA and stenting of his left renal artery back in April 1996. This has subsequently been noted to be occluded. His other problems include hypertension and hyperlipidemia. He has an AAA that was Rx'd with a stent graft by Dr Durene Cal. He had a negative Myoview pre-stenting Jan 2013. He denies chest pain or shortness of breath. He has had an episode of gout and transient hematuria, both treated by the [place].    Current Outpatient Prescriptions  Medication Sig Dispense Refill  . aspirin 325 MG tablet Take 325 mg by mouth daily.      . benazepril (LOTENSIN) 20 MG tablet Take 20 mg by mouth daily. 1/2 tab daily      . bumetanide (BUMEX) 0.5 MG tablet Take 0.5 mg by mouth daily.      . Cholecalciferol (VITAMIN D3) 1000 UNITS CAPS Take 2,000 capsules by mouth daily.       . fish oil-omega-3 fatty acids 1000 MG capsule Take 3 g by mouth daily.      . Multiple Vitamin (MULTIVITAMIN) tablet Take 1 tablet by mouth daily.      Marland Kitchen NIACIN PO Take by mouth daily.      . Potassium 99 MG TABS Take 99 mg by mouth 2 (two) times daily.       . simvastatin (ZOCOR) 80 MG tablet Take 20 mg by mouth at bedtime.        No current facility-administered medications for this visit.    Allergies  Allergen Reactions  . Codeine Nausea And Vomiting    History   Social History  . Marital Status: Married    Spouse Name: N/A    Number of Children: N/A  . Years of Education: N/A   Occupational History  . Not on file.   Social History Main Topics  . Smoking status: Former Smoker -- 1.00 packs/day    Quit date: 10/05/1966  . Smokeless  tobacco: Never Used  . Alcohol Use: No  . Drug Use: No  . Sexually Active: Not on file   Other Topics Concern  . Not on file   Social History Narrative  . No narrative on file     Review of Systems: General: negative for chills, fever, night sweats or weight changes.  Cardiovascular: negative for chest pain, dyspnea on exertion, edema, orthopnea, palpitations, paroxysmal nocturnal dyspnea or shortness of breath Dermatological: negative for rash Respiratory: negative for cough or wheezing Urologic: negative for hematuria Abdominal: negative for nausea, vomiting, diarrhea, bright red blood per rectum, melena, or hematemesis Neurologic: negative for visual changes, syncope, or dizziness All other systems reviewed and are otherwise negative except as noted above.    Blood pressure 124/78, pulse 65, height 5\' 9"  (1.753 m), weight 217 lb 9.6 oz (98.703 kg).  General appearance: alert, cooperative, no distress and moderately obese Lungs: clear to auscultation bilaterally Heart: regular rate and rhythm Abdomen: no edema  EKG  EKG: normal EKG, normal sinus rhythm, unchanged from previous tracings.  ASSESSMENT AND PLAN:   AAA (abdominal aortic aneurysm)- S/P Ao stent graft 2/13 .  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  Gout Recent brief gout attack  Hematuria- negative work up at the [facility] .  Agent orange exposure .    PLAN  Same Rx. I have asked him to get the [facility] to send any recent lab results. He should see Dr Allyson Sabal in 6 months.   Floria Brandau KPA-C 04/09/2013 2:11 PM

## 2013-04-09 NOTE — Assessment & Plan Note (Signed)
[facility]

## 2013-04-09 NOTE — Assessment & Plan Note (Signed)
B/P controlled, last SCr WNL

## 2013-04-09 NOTE — Patient Instructions (Addendum)
Dr Allyson Sabal 6 months. Please ask [facility] to [facility] labs

## 2013-04-09 NOTE — Assessment & Plan Note (Signed)
Recent brief gout attack

## 2013-07-30 ENCOUNTER — Other Ambulatory Visit (HOSPITAL_COMMUNITY): Payer: Medicare Other

## 2013-07-30 ENCOUNTER — Ambulatory Visit: Payer: Medicare Other | Admitting: Surgery

## 2013-08-15 ENCOUNTER — Encounter: Payer: Self-pay | Admitting: Surgery

## 2013-08-20 ENCOUNTER — Encounter: Payer: Self-pay | Admitting: Surgery

## 2013-08-20 ENCOUNTER — Ambulatory Visit (INDEPENDENT_AMBULATORY_CARE_PROVIDER_SITE_OTHER): Payer: Medicare Other | Admitting: Surgery

## 2013-08-20 ENCOUNTER — Ambulatory Visit (HOSPITAL_COMMUNITY)
Admission: RE | Admit: 2013-08-20 | Discharge: 2013-08-20 | Disposition: A | Discharge status: Home / Self Care | Payer: Medicare Other | Source: Ambulatory Visit | Attending: Surgery | Admitting: Surgery

## 2013-08-20 VITALS — BP 127/71 | HR 61 | Ht 69.0 in | Wt 212.0 lb

## 2013-08-20 DIAGNOSIS — I714 Abdominal aortic aneurysm, without rupture, unspecified: Secondary | ICD-10-CM

## 2013-08-20 DIAGNOSIS — Z48812 Encounter for surgical aftercare following surgery on the circulatory system: Secondary | ICD-10-CM | POA: Diagnosis not present

## 2013-08-20 NOTE — Progress Notes (Signed)
Patient name: Joseph Contreras MRN: [id number] DOB: 01/02/1941 Sex: male     Chief Complaint  Patient presents with  . AAA    6 month f/u - pt has no complaints    HISTORY OF PRESENT ILLNESS: The patient is here today for followup. He is status post endovascular aneurysm repair on 11/18/2011. His initial CT scan showed complete exclusion of the aneurysm without evidence of endoleak.  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  He is back for followup.  He has no complaints today.   Past Medical History  Diagnosis Date  . Hypertension   . Hypercholesterolemia   . Gout   . Cancer     melanoma, squesmous cell hand, back  . Kidney atrophy     left, known occluded LRA stent  . Gout   . AAA (abdominal aortic aneurysm) 10/29/11    endovascular stent graft  . 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 Normal coronary arteries 1996    low risk Myoview Jan 2013    Past Surgical History  Procedure Laterality Date  . Left renal stent  1996    now occluded  . Cardiac catheterization  1995    normal cornaries  . Vasectomy  09/1988  . Ganglion cyst excision  79    rt wrist  . Tonsillectomy  1949  . Eye surgery  2.5.13    retinal detachment    . Abdominal aortic endovascular stent graft  Feb 2013    History   Social History  . Marital Status: Married    Spouse Name: N/A    Number of Children: N/A  . Years of Education: N/A   Occupational History  . Not on file.   Social History Main Topics  . Smoking status: Former Smoker -- 1.00 packs/day    Quit date: 10/05/1966  . Smokeless tobacco: Never Used  . Alcohol Use: No  . Drug Use: No  . Sexual Activity: Not on file   Other Topics Concern  . Not on file   Social History Narrative  . No narrative on file    Family History  Problem Relation Age of Onset  . Heart disease Father   . Heart attack Father   . Diabetes Mother   . Hyperlipidemia Sister   . Hypertension Sister   .  Hyperlipidemia Brother   . Hypertension Brother     Allergies as of 08/20/2013 - Review Complete 08/20/2013  Allergen Reaction Noted  . Codeine Nausea And Vomiting 10/06/2011    Current Outpatient Prescriptions on File Prior to Visit  Medication Sig Dispense Refill  . aspirin 325 MG tablet Take 325 mg by mouth daily.      . benazepril (LOTENSIN) 20 MG tablet Take 20 mg by mouth daily. 1/2 tab daily      . bumetanide (BUMEX) 0.5 MG tablet Take 0.5 mg by mouth daily.      . Cholecalciferol (VITAMIN D3) 1000 UNITS CAPS Take 2,000 capsules by mouth daily.       . fish oil-omega-3 fatty acids 1000 MG capsule Take 3 g by mouth daily.      . Multiple Vitamin (MULTIVITAMIN) tablet Take 1 tablet by mouth daily.      Marland Kitchen NIACIN PO Take by mouth daily.      . Potassium 99 MG TABS Take 99 mg by mouth 2 (two) times daily.       Marland Kitchen  simvastatin (ZOCOR) 80 MG tablet Take 20 mg by mouth at bedtime.        No current facility-administered medications on file prior to visit.     REVIEW OF SYSTEMS: Unchanged from visit 6 months ago  PHYSICAL EXAMINATION:   Vital signs are BP 127/71  Pulse 61  Ht 5\' 9"  (1.753 m)  Wt 212 lb (96.163 kg)  BMI 31.29 kg/m2  SpO2 97% General: The patient appears their stated age. HEENT:  No gross abnormalities Pulmonary:  Non labored breathing Abdomen: Soft and non-tender.  No pulsatile mass Musculoskeletal: There are no major deformities. Neurologic: No focal weakness or paresthesias are detected, Skin: There are no ulcer or rashes noted. Psychiatric: The patient has normal affect. Cardiovascular: Palpable pedal pulses bilaterally   Diagnostic Studies I have ordered and reviewed his ultrasound.  This shows a patent endovascular aortic repair with residual aneurysmal sac measurement of 4.5 x 4.5 cm.  There is a questionable small endoleak with minimal flow observed within the sac  Assessment: Status post endovascular aneurysm repair Plan: The patient is doing  very well.  Ultrasound reveals a aneurysm sac it is slightly smaller in size.  There is a questionable type II endoleak.  This was not seen on his one month post operative CT scan.  The aneurysm sac has not increased in size.  I discussed these findings with the patient today I have recommended continued ultrasound surveillance.  I do not feel a CT scan is warranted at this time, because the aneurysm has not increased in size and therefore the likelihood of an intervention is low.  The patient will followup in 6 months with a repeat ultrasound.  I stressed the importance of continued surveillance with the patient   V. Charlena Cross, M.D. [facility] [place] Office: [phone] Pager:  [phone]

## 2013-08-21 NOTE — Addendum Note (Signed)
Addended by: Adria Dill L on: 08/21/2013 10:42 AM   Modules accepted: Orders

## 2013-09-04 ENCOUNTER — Telehealth: Payer: Self-pay | Admitting: *Deleted

## 2013-09-04 DIAGNOSIS — L570 Actinic keratosis: Secondary | ICD-10-CM | POA: Diagnosis not present

## 2013-09-04 DIAGNOSIS — L821 Other seborrheic keratosis: Secondary | ICD-10-CM | POA: Diagnosis not present

## 2013-09-04 NOTE — Telephone Encounter (Signed)
Patient presented [facility] forms about ischemic heart disease by walking in one day.  I need to speak with the patient about his symptoms.  I left a message for the patient to call me.

## 2013-09-05 NOTE — Telephone Encounter (Signed)
Returning your call from yesterday. °

## 2013-09-06 NOTE — Telephone Encounter (Signed)
lmom 

## 2013-09-07 NOTE — Telephone Encounter (Signed)
I spoke with patient and informed him that forms will be mailed on Monday.  I explained that he does not have ischemic heart disease.  Pt verbalized understanding.

## 2013-10-02 ENCOUNTER — Other Ambulatory Visit (HOSPITAL_COMMUNITY): Payer: Self-pay | Admitting: Cardiovascular Disease

## 2013-10-02 DIAGNOSIS — I701 Atherosclerosis of renal artery: Secondary | ICD-10-CM

## 2013-10-05 ENCOUNTER — Ambulatory Visit (HOSPITAL_COMMUNITY)
Admission: RE | Admit: 2013-10-05 | Discharge: 2013-10-05 | Disposition: A | Discharge status: Home / Self Care | Payer: Medicare Other | Source: Ambulatory Visit | Attending: Cardiovascular Disease | Admitting: Cardiovascular Disease

## 2013-10-05 DIAGNOSIS — I714 Abdominal aortic aneurysm, without rupture, unspecified: Secondary | ICD-10-CM | POA: Diagnosis not present

## 2013-10-05 DIAGNOSIS — I701 Atherosclerosis of renal artery: Secondary | ICD-10-CM

## 2013-10-05 DIAGNOSIS — Z9889 Other specified postprocedural states: Secondary | ICD-10-CM | POA: Diagnosis not present

## 2013-10-05 NOTE — Progress Notes (Signed)
Renal Duplex Completed. Cheyne Boulden, BS, RDMS, RVT  

## 2013-10-15 ENCOUNTER — Telehealth: Payer: Self-pay | Admitting: *Deleted

## 2013-10-15 ENCOUNTER — Encounter: Payer: Self-pay | Admitting: *Deleted

## 2013-10-15 DIAGNOSIS — I701 Atherosclerosis of renal artery: Secondary | ICD-10-CM

## 2013-10-15 NOTE — Telephone Encounter (Signed)
Order placed for repeat renal doppler in 1 year 

## 2013-10-15 NOTE — Telephone Encounter (Signed)
Message copied by Chauncy Lean on Mon Oct 15, 2013  3:18 PM ------      Message from: Lorretta Harp      Created: Sun Oct 14, 2013 10:18 PM       No change from prior study. Repeat in 12 months. ------

## 2013-10-30 ENCOUNTER — Encounter: Payer: Self-pay | Admitting: Cardiovascular Disease

## 2013-10-30 ENCOUNTER — Ambulatory Visit (INDEPENDENT_AMBULATORY_CARE_PROVIDER_SITE_OTHER): Payer: Medicare Other | Admitting: Cardiovascular Disease

## 2013-10-30 VITALS — BP 128/80 | HR 72 | Ht 70.0 in | Wt 216.4 lb

## 2013-10-30 DIAGNOSIS — I10 Essential (primary) hypertension: Secondary | ICD-10-CM | POA: Diagnosis not present

## 2013-10-30 DIAGNOSIS — I701 Atherosclerosis of renal artery: Secondary | ICD-10-CM

## 2013-10-30 DIAGNOSIS — I714 Abdominal aortic aneurysm, without rupture, unspecified: Secondary | ICD-10-CM | POA: Diagnosis not present

## 2013-10-30 DIAGNOSIS — E7800 Pure hypercholesterolemia, unspecified: Secondary | ICD-10-CM | POA: Diagnosis not present

## 2013-10-30 NOTE — Assessment & Plan Note (Signed)
Status post left renal artery PTA and stenting by myself 12/21/94 with documentation of occlusion 7 years ago. We continue to follow him I did ultrasound was recently done 10/05/13 revealing a widely patent right renal artery

## 2013-10-30 NOTE — Patient Instructions (Signed)
Your physician wants you to follow-up in: 6 months with Kerin Ransom PA and 1 year with Dr Gwenlyn Found. You will receive a reminder letter in the mail two months in advance. If you don't receive a letter, please call our office to schedule the follow-up appointment.

## 2013-10-30 NOTE — Progress Notes (Signed)
10/30/2013 Joseph Contreras   1940-11-14  [id number]  Primary Physician Ann Held, MD Primary Cardiologist: Lorretta Harp MD Renae Gloss   HPI:  The patient is a very pleasant 73 year old mildly overweight married Caucasian male, father of 49 and grandfather of 5 grandchildren, whom I last saw 6 months ago. He is accompanied by his wife today. I performed PTA and stenting of his left renal artery back in April 1996. His other problems include hypertension and hyperlipidemia. We have been following his renal Doppler studies which have since shown occlusion of his left renal artery approximately 7 years ago with a patent right renal artery. We have also been following his abdominal aorta, which had grown to 5.5 cm. I referred him to Dr. Annamarie Major, who performed abdominal stent grafting on him February of last year. He had a negative Myoview as part of that. He denies chest pain or shortness of breath. He had lab work performed at the [place] revealing a total cholesterol of 146, LDL of 45, HDL of 39. His triglyceride level was 310 but he does admit to dietary indiscretion. Apparently there is more recent lab work as well. He saw Kerin Ransom PAC back 6 months ago and has been doing well since. He denies chest pain or shortness of breath.     Current Outpatient Prescriptions  Medication Sig Dispense Refill  . aspirin 325 MG tablet Take 325 mg by mouth daily.      . benazepril (LOTENSIN) 20 MG tablet Take 10 mg by mouth daily.       . bumetanide (BUMEX) 0.5 MG tablet Take 0.5 mg by mouth daily.      . Cholecalciferol (VITAMIN D3) 1000 UNITS CAPS Take 2,000 capsules by mouth daily.       . fish oil-omega-3 fatty acids 1000 MG capsule Take 3 g by mouth daily.      . Multiple Vitamin (MULTIVITAMIN) tablet Take 1 tablet by mouth daily.      Marland Kitchen NIACIN PO Take by mouth daily.      . Potassium 99 MG TABS Take 99 mg by mouth 2 (two) times daily.       . simvastatin (ZOCOR) 20 MG tablet  Take 20 mg by mouth daily.       No current facility-administered medications for this visit.    Allergies  Allergen Reactions  . Codeine Nausea And Vomiting    History   Social History  . Marital Status: Married    Spouse Name: N/A    Number of Children: N/A  . Years of Education: N/A   Occupational History  . Not on file.   Social History Main Topics  . Smoking status: Former Smoker -- 1.00 packs/day    Quit date: 10/05/1966  . Smokeless tobacco: Never Used  . Alcohol Use: No  . Drug Use: No  . Sexual Activity: Not on file   Other Topics Concern  . Not on file   Social History Narrative  . No narrative on file     Review of Systems: General: negative for chills, fever, night sweats or weight changes.  Cardiovascular: negative for chest pain, dyspnea on exertion, edema, orthopnea, palpitations, paroxysmal nocturnal dyspnea or shortness of breath Dermatological: negative for rash Respiratory: negative for cough or wheezing Urologic: negative for hematuria Abdominal: negative for nausea, vomiting, diarrhea, bright red blood per rectum, melena, or hematemesis Neurologic: negative for visual changes, syncope, or dizziness All other systems reviewed and are otherwise  negative except as noted above.    Blood pressure 128/80, pulse 72, height 5\' 10"  (1.778 m), weight 216 lb 6.4 oz (98.158 kg).  General appearance: alert and no distress Neck: no adenopathy, no carotid bruit, no JVD, supple, symmetrical, trachea midline and thyroid not enlarged, symmetric, no tenderness/mass/nodules Lungs: clear to auscultation bilaterally Heart: regular rate and rhythm, S1, S2 normal, no murmur, click, rub or gallop Extremities: extremities normal, atraumatic, no cyanosis or edema  EKG normal sinus rhythm at 72 with no ST or T wave changes  ASSESSMENT AND PLAN:   Renal artery stenosis- remote LRA stent, known to be occluded now Status post left renal artery PTA and stenting by  myself 12/21/94 with documentation of occlusion 7 years ago. We continue to follow him I did ultrasound was recently done 10/05/13 revealing a widely patent right renal artery  AAA (abdominal aortic aneurysm)- S/P Ao stent graft 2/13 Status post endoluminal stent grafting by Dr. Trula Slade from February 2013. Recent CT scan showed no evidence of endoleak.  Hypercholesterolemia On statin therapy followed by the [facility]  Essential hypertension Under good control and her medications      Lorretta Harp MD [facility] 10/30/2013 3:46 PM

## 2013-10-30 NOTE — Assessment & Plan Note (Signed)
Status post endoluminal stent grafting by Dr. Trula Slade from February 2013. Recent CT scan showed no evidence of endoleak.

## 2013-10-30 NOTE — Assessment & Plan Note (Signed)
Under good control and her medications 

## 2013-10-30 NOTE — Assessment & Plan Note (Signed)
On statin therapy followed by the [facility] 

## 2014-02-18 ENCOUNTER — Ambulatory Visit: Payer: Medicare Other | Admitting: Surgery

## 2014-02-18 ENCOUNTER — Other Ambulatory Visit (HOSPITAL_COMMUNITY): Payer: Medicare Other

## 2014-02-19 DIAGNOSIS — L570 Actinic keratosis: Secondary | ICD-10-CM | POA: Diagnosis not present

## 2014-02-19 DIAGNOSIS — D0470 Carcinoma in situ of skin of unspecified lower limb, including hip: Secondary | ICD-10-CM | POA: Diagnosis not present

## 2014-02-19 DIAGNOSIS — L408 Other psoriasis: Secondary | ICD-10-CM | POA: Diagnosis not present

## 2014-02-19 DIAGNOSIS — C44621 Squamous cell carcinoma of skin of unspecified upper limb, including shoulder: Secondary | ICD-10-CM | POA: Diagnosis not present

## 2014-02-22 ENCOUNTER — Encounter: Payer: Self-pay | Admitting: Family

## 2014-02-25 ENCOUNTER — Encounter: Payer: Self-pay | Admitting: Family

## 2014-02-25 ENCOUNTER — Ambulatory Visit (INDEPENDENT_AMBULATORY_CARE_PROVIDER_SITE_OTHER): Payer: Medicare Other | Admitting: Family

## 2014-02-25 ENCOUNTER — Ambulatory Visit (HOSPITAL_COMMUNITY)
Admission: RE | Admit: 2014-02-25 | Discharge: 2014-02-25 | Disposition: A | Discharge status: Home / Self Care | Payer: Medicare Other | Source: Ambulatory Visit | Attending: Family | Admitting: Family

## 2014-02-25 VITALS — BP 122/76 | HR 63 | Resp 16 | Ht 69.0 in | Wt 210.0 lb

## 2014-02-25 DIAGNOSIS — I714 Abdominal aortic aneurysm, without rupture, unspecified: Secondary | ICD-10-CM | POA: Diagnosis not present

## 2014-02-25 DIAGNOSIS — Z48812 Encounter for surgical aftercare following surgery on the circulatory system: Secondary | ICD-10-CM | POA: Diagnosis not present

## 2014-02-25 NOTE — Progress Notes (Signed)
[facility] [place]  Established EVAR  History of Present Illness  Joseph Contreras is a 73 y.o. (1941/07/20) male patient of Dr. Trula Slade. The patient is here today for followup. He is status post endovascular aneurysm repair on 11/18/2011. His initial CT scan showed complete exclusion of the aneurysm without evidence of endoleak. 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  The patient has denies back or abdominal pain. Pt denies any history of stroke or TIA, denies claudication symptoms in legs with walking, denies non healing wounds. Pt reports that he has left leg sciatic pain if he stands more than 3 hours.  Pt takes a daily statin, ASA, and no anticoagulants.   Pt Diabetic: Yes, 6.5 A1C about 2 months ago at the [facility] in [place] Pt smoker: former smoker, quit in 1968   Past Medical History  Diagnosis Date  . Hypertension   . Hypercholesterolemia   . Gout   . Cancer     melanoma, squesmous cell hand, back  . Kidney atrophy     left, known occluded LRA stent  . Gout   . AAA (abdominal aortic aneurysm) 10/29/11    endovascular stent graft  . 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 Normal coronary arteries 1996    low risk Myoview Jan 2013   Past Surgical History  Procedure Laterality Date  . Left renal stent  1996    now occluded  . Cardiac catheterization  1995    normal cornaries  . Vasectomy  09/1988  . Ganglion cyst excision  79    rt wrist  . Tonsillectomy  1949  . Eye surgery  2.5.13    retinal detachment    . Abdominal aortic endovascular stent graft  Feb 2013   Social History History  Substance Use Topics  . Smoking status: Former Smoker -- 1.00 packs/day    Quit date: 10/05/1966  . Smokeless tobacco: Never Used  . Alcohol Use: No   Family History Family History  Problem Relation Age of Onset  . Heart disease Father   . Heart attack Father   . Diabetes Mother   . Hyperlipidemia  Sister   . Hypertension Sister   . Hyperlipidemia Brother   . Hypertension Brother    Current Outpatient Prescriptions on File Prior to Visit  Medication Sig Dispense Refill  . aspirin 325 MG tablet Take 325 mg by mouth daily.      . benazepril (LOTENSIN) 20 MG tablet Take 10 mg by mouth daily.       . bumetanide (BUMEX) 0.5 MG tablet Take 0.5 mg by mouth daily.      . Cholecalciferol (VITAMIN D3) 1000 UNITS CAPS Take 2,000 capsules by mouth daily.       . fish oil-omega-3 fatty acids 1000 MG capsule Take 3 g by mouth daily.      . Multiple Vitamin (MULTIVITAMIN) tablet Take 1 tablet by mouth daily.      Marland Kitchen NIACIN PO Take by mouth daily.      . Potassium 99 MG TABS Take 99 mg by mouth 2 (two) times daily.       . simvastatin (ZOCOR) 20 MG tablet Take 20 mg by mouth daily.       No current facility-administered medications on file prior to visit.   Allergies  Allergen Reactions  . Codeine Nausea And Vomiting     ROS: See HPI for pertinent positives  and negatives.  Physical Examination  Filed Vitals:   02/25/14 0912  BP: 122/76  Pulse: 63  Resp: 16  Height: 5\' 9"  (1.753 m)  Weight: 210 lb (95.255 kg)  SpO2: 95%   Body mass index is 31 kg/(m^2).  General: A&O x 3, WD, Obese.  Pulmonary: Sym exp, good air movt, CTAB, no rales, rhonchi, or wheezing.   Cardiac: RRR, Nl S1, S2, no Murmur detected.  Vascular: Vessel Right Left  Radial 2+Palpable 2+Palpable  Carotid  without bruit  without bruit  Popliteal Not palpable Not palpable  PT Palpable Palpable  DP Palpable Palpable   Gastrointestinal: soft, NTND, -G/R, - HSM, - masses, - CVAT B.  Musculoskeletal: M/S 5/5 throughout except LLE is 4/5, Extremities without ischemic changes  Neurologic: Pain and light touch intact in extremities, CN 2-12 intact, Motor exam as listed above  Non-Invasive Vascular Imaging  EVAR Duplex (Date: 02/25/2014) ABDOMINAL AORTA DUPLEX EVALUATION - POST ENDOVASCULAR REPAIR     INDICATION: Evaluation of endovascular abdominal repair of aortic aneurysm.    PREVIOUS INTERVENTION(S): EVAR 10/29/2011.    DUPLEX EXAM:      DIAMETER AP (cm) DIAMETER TRANSVERSE (cm) VELOCITIES (cm/sec)  Aorta 4.50 4.50 76  Right Common Iliac 1.81  58  Left Common Iliac 1.26 1.21 61    Comparison Study       Date DIAMETER AP (cm) DIAMETER TRANSVERSE (cm)  08/20/2013 4.54 4.55     ADDITIONAL FINDINGS:     IMPRESSION: Patent endovascular abdominal aortic repair with evidence of a small endoleak with minimal flow detected within the sac. Largest diameter measurement is 4.50 cm x 4.50 cm. Limited visualization of the right common iliac artery.    Compared to the previous exam:  No significant change in comparison to the last exam on 08/20/2013.     AAA sac size: 4.5 cm x 4.5 cm; previous (08/20/13) was 4.54 x 4.55 cm  Evidence of a small endoleak detected with minimal flow detected in the sac  Medical Decision Making  Joseph Contreras is a 73 y.o. male who presents s/p EVAR.  Pt is asymptomatic with slight decrease in sac size.  I discussed with the patient the importance of surveillance of the endograft.  The next endograft duplex will be scheduled for 6 months.  The patient will follow up with [place] in 6 months with these studies.  I emphasized the importance of maximal medical management including strict control of blood pressure, blood glucose, and lipid levels, antiplatelet agents, obtaining regular exercise, and cessation of smoking.   The patient was given information about AAA including signs, symptoms, treatment, and how to minimize the risk of enlargement and rupture of aneurysms.    Thank you for allowing [place] to participate in this patient's care.  Clemon Chambers, RN, MSN, FNP-C [facility] [place] Office: [place]: Trula Slade  02/25/2014, 9:18 AM

## 2014-02-25 NOTE — Patient Instructions (Signed)

## 2014-03-25 DIAGNOSIS — C44621 Squamous cell carcinoma of skin of unspecified upper limb, including shoulder: Secondary | ICD-10-CM | POA: Diagnosis not present

## 2014-07-15 DIAGNOSIS — L400 Psoriasis vulgaris: Secondary | ICD-10-CM | POA: Diagnosis not present

## 2014-07-15 DIAGNOSIS — C44712 Basal cell carcinoma of skin of right lower limb, including hip: Secondary | ICD-10-CM | POA: Diagnosis not present

## 2014-07-15 DIAGNOSIS — L570 Actinic keratosis: Secondary | ICD-10-CM | POA: Diagnosis not present

## 2014-07-17 DIAGNOSIS — C44712 Basal cell carcinoma of skin of right lower limb, including hip: Secondary | ICD-10-CM | POA: Diagnosis not present

## 2014-09-02 ENCOUNTER — Other Ambulatory Visit (HOSPITAL_COMMUNITY): Payer: Medicare Other

## 2014-09-02 ENCOUNTER — Ambulatory Visit: Payer: Medicare Other | Admitting: Family

## 2014-09-03 ENCOUNTER — Encounter: Payer: Self-pay | Admitting: Family

## 2014-09-04 ENCOUNTER — Encounter: Payer: Self-pay | Admitting: Family

## 2014-09-04 ENCOUNTER — Ambulatory Visit (HOSPITAL_COMMUNITY)
Admission: RE | Admit: 2014-09-04 | Discharge: 2014-09-04 | Disposition: A | Discharge status: Home / Self Care | Payer: Medicare Other | Source: Ambulatory Visit | Attending: Family | Admitting: Family

## 2014-09-04 ENCOUNTER — Ambulatory Visit (INDEPENDENT_AMBULATORY_CARE_PROVIDER_SITE_OTHER): Payer: Medicare Other | Admitting: Family

## 2014-09-04 VITALS — BP 125/76 | HR 54 | Resp 14 | Ht 69.0 in | Wt 213.0 lb

## 2014-09-04 DIAGNOSIS — Z48812 Encounter for surgical aftercare following surgery on the circulatory system: Secondary | ICD-10-CM | POA: Diagnosis not present

## 2014-09-04 DIAGNOSIS — Z09 Encounter for follow-up examination after completed treatment for conditions other than malignant neoplasm: Secondary | ICD-10-CM | POA: Diagnosis not present

## 2014-09-04 DIAGNOSIS — Z79899 Other long term (current) drug therapy: Secondary | ICD-10-CM | POA: Diagnosis not present

## 2014-09-04 DIAGNOSIS — Z7982 Long term (current) use of aspirin: Secondary | ICD-10-CM | POA: Insufficient documentation

## 2014-09-04 DIAGNOSIS — E780 Pure hypercholesterolemia: Secondary | ICD-10-CM | POA: Insufficient documentation

## 2014-09-04 DIAGNOSIS — I701 Atherosclerosis of renal artery: Secondary | ICD-10-CM | POA: Diagnosis not present

## 2014-09-04 DIAGNOSIS — Z87891 Personal history of nicotine dependence: Secondary | ICD-10-CM | POA: Insufficient documentation

## 2014-09-04 DIAGNOSIS — Z95828 Presence of other vascular implants and grafts: Secondary | ICD-10-CM

## 2014-09-04 DIAGNOSIS — M5432 Sciatica, left side: Secondary | ICD-10-CM | POA: Insufficient documentation

## 2014-09-04 DIAGNOSIS — I714 Abdominal aortic aneurysm, without rupture, unspecified: Secondary | ICD-10-CM

## 2014-09-04 DIAGNOSIS — I10 Essential (primary) hypertension: Secondary | ICD-10-CM | POA: Insufficient documentation

## 2014-09-04 NOTE — Patient Instructions (Signed)
Abdominal Aortic Aneurysm An aneurysm is a weakened or damaged part of an artery wall that bulges from the normal force of blood pumping through the body. An abdominal aortic aneurysm is an aneurysm that occurs in the lower part of the aorta, the main artery of the body.  The major concern with an abdominal aortic aneurysm is that it can enlarge and burst (rupture) or blood can flow between the layers of the wall of the aorta through a tear (aorticdissection). Both of these conditions can cause bleeding inside the body and can be life threatening unless diagnosed and treated promptly. CAUSES  The exact cause of an abdominal aortic aneurysm is unknown. Some contributing factors are:   A hardening of the arteries caused by the buildup of fat and other substances in the lining of a blood vessel (arteriosclerosis).  Inflammation of the walls of an artery (arteritis).   Connective tissue diseases, such as Marfan syndrome.   Abdominal trauma.   An infection, such as syphilis or staphylococcus, in the wall of the aorta (infectious aortitis) caused by bacteria. RISK FACTORS  Risk factors that contribute to an abdominal aortic aneurysm may include:  Age older than 60 years.   High blood pressure (hypertension).  Male gender.  Ethnicity (white race).  Obesity.  Family history of aneurysm (first degree relatives only).  Tobacco use. PREVENTION  The following healthy lifestyle habits may help decrease your risk of abdominal aortic aneurysm:  Quitting smoking. Smoking can raise your blood pressure and cause arteriosclerosis.  Limiting or avoiding alcohol.  Keeping your blood pressure, blood sugar level, and cholesterol levels within normal limits.  Decreasing your salt intake. In somepeople, too much salt can raise blood pressure and increase your risk of abdominal aortic aneurysm.  Eating a diet low in saturated fats and cholesterol.  Increasing your fiber intake by including  whole grains, vegetables, and fruits in your diet. Eating these foods may help lower blood pressure.  Maintaining a healthy weight.  Staying physically active and exercising regularly. SYMPTOMS  The symptoms of abdominal aortic aneurysm may vary depending on the size and rate of growth of the aneurysm.Most grow slowly and do not have any symptoms. When symptoms do occur, they may include:  Pain (abdomen, side, lower back, or groin). The pain may vary in intensity. A sudden onset of severe pain may indicate that the aneurysm has ruptured.  Feeling full after eating only small amounts of food.  Nausea or vomiting or both.  Feeling a pulsating lump in the abdomen.  Feeling faint or passing out. DIAGNOSIS  Since most unruptured abdominal aortic aneurysms have no symptoms, they are often discovered during diagnostic exams for other conditions. An aneurysm may be found during the following procedures:  Ultrasonography (A one-time screening for abdominal aortic aneurysm by ultrasonography is also recommended for all men aged 65-75 years who have ever smoked).  X-ray exams.  A computed tomography (CT).  Magnetic resonance imaging (MRI).  Angiography or arteriography. TREATMENT  Treatment of an abdominal aortic aneurysm depends on the size of your aneurysm, your age, and risk factors for rupture. Medication to control blood pressure and pain may be used to manage aneurysms smaller than 6 cm. Regular monitoring for enlargement may be recommended by your caregiver if:  The aneurysm is 3-4 cm in size (an annual ultrasonography may be recommended).  The aneurysm is 4-4.5 cm in size (an ultrasonography every 6 months may be recommended).  The aneurysm is larger than 4.5 cm in   size (your caregiver may ask that you be examined by a vascular surgeon). If your aneurysm is larger than 6 cm, surgical repair may be recommended. There are two main methods for repair of an aneurysm:   Endovascular  repair (a minimally invasive surgery). This is done most often.  Open repair. This method is used if an endovascular repair is not possible. Document Released: 06/16/2005 Document Revised: 01/01/2013 Document Reviewed: 10/06/2012 Exit[employer] Patient Information 2015 [employer]. This information is not intended to replace advice given to you by your health care provider. Make sure you discuss any questions you have with your health care provider.  

## 2014-09-04 NOTE — Progress Notes (Signed)
[facility] [place]  Established EVAR  History of Present Illness  Joseph Contreras is a 73 y.o. (06-29-1941) male patient of Dr. Trula Slade. The patient is here today for followup. He is status post endovascular aneurysm repair on 11/18/2011. His initial CT scan showed complete exclusion of the aneurysm without evidence of endoleak. 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 Pt states Dr. Trula Slade told him that he will be evaluated every six months re the EVAR. The patient has denies back or abdominal pain. Pt denies any history of stroke or TIA, denies claudication symptoms in legs with walking, denies non healing wounds. Pt reports that he has left leg sciatic pain if he stands more than 3 hours, but this has not bothered him in a long time; states he is physically active. The pt denies non healing wounds. His PCP is the [facility] in [place]. Pt states Dr. Gwenlyn Found is following his renal artery stent.  Pt takes a daily statin, ASA, and no anticoagulants.  Pt Diabetic: Yes, pt does not check his home serum glucose Pt smoker: former smoker, quit in 1968    Past Medical History  Diagnosis Date  . Hypertension   . Hypercholesterolemia   . Gout   . Cancer     melanoma, squesmous cell hand, back  . Kidney atrophy     left, known occluded LRA stent  . Gout   . AAA (abdominal aortic aneurysm) 10/29/11    endovascular stent graft  . 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 Normal coronary arteries 1996    low risk Myoview Jan 2013   Past Surgical History  Procedure Laterality Date  . Left renal stent  1996    now occluded  . Cardiac catheterization  1995    normal cornaries  . Vasectomy  09/1988  . Ganglion cyst excision  79    rt wrist  . Tonsillectomy  1949  . Eye surgery  2.5.13    retinal detachment    . Abdominal aortic endovascular stent graft  Feb 2013   Social History History  Substance Use Topics  .  Smoking status: Former Smoker -- 1.00 packs/day    Quit date: 10/05/1966  . Smokeless tobacco: Never Used  . Alcohol Use: No   Family History Family History  Problem Relation Age of Onset  . Heart disease Father   . Heart attack Father   . Diabetes Mother   . Hyperlipidemia Sister   . Hypertension Sister   . Hyperlipidemia Brother   . Hypertension Brother    Current Outpatient Prescriptions on File Prior to Visit  Medication Sig Dispense Refill  . aspirin 325 MG tablet Take 325 mg by mouth daily.    . benazepril (LOTENSIN) 20 MG tablet Take 10 mg by mouth daily.     . bumetanide (BUMEX) 0.5 MG tablet Take 0.5 mg by mouth daily.    . Cholecalciferol (VITAMIN D3) 1000 UNITS CAPS Take 2,000 capsules by mouth daily.     . fish oil-omega-3 fatty acids 1000 MG capsule Take 3 g by mouth daily.    . Multiple Vitamin (MULTIVITAMIN) tablet Take 1 tablet by mouth daily.    Marland Kitchen NIACIN PO Take by mouth daily.    . Potassium 99 MG TABS Take 99 mg by mouth 2 (two) times daily.     . simvastatin (ZOCOR) 20 MG tablet Take 20 mg by mouth daily.  No current facility-administered medications on file prior to visit.   Allergies  Allergen Reactions  . Codeine Nausea And Vomiting     ROS: See HPI for pertinent positives and negatives.  Physical Examination  Filed Vitals:   09/04/14 1050  BP: 125/76  Pulse: 54  Resp: 14  Height: 5\' 9"  (1.753 m)  Weight: 213 lb (96.616 kg)  SpO2: 96%   Body mass index is 31.44 kg/(m^2).   General: A&O x 3, WD, obese male.  Pulmonary: Sym exp, good air movt, CTAB, no rales, rhonchi, or wheezing.   Cardiac: RRR, Nl S1, S2, no murmur detected.  Vascular: Vessel Right Left  Radial 2+Palpable 2+Palpable  Carotid  without bruit without bruit  Aorta Not palpable N/A  Femoral 2+ palpable 2+ palpable  Popliteal Not palpable Not palpable  PT 2+Palpable 2+Not Palpable  DP 2+Palpable Palpable   Gastrointestinal: soft, NTND, -G/R,  - HSM, - palpable masses, - CVAT B.  Musculoskeletal: M/S 5/5 throughout except LLE is 4/5, Extremities without ischemic changes  Neurologic: Pain and light touch intact in extremities, CN 2-12 intact, Motor exam as listed above    Non-Invasive Vascular Imaging  EVAR Duplex (Date: 09/04/2014) ABDOMINAL AORTA DUPLEX EVALUATION - POST ENDOVASCULAR REPAIR    INDICATION: Abdominal Aortic Aneurysm    PREVIOUS INTERVENTION(S): Endovascular Aortic Repair  and Left EIA to IIA BPG performed 01/29/2014.    DUPLEX EXAM:      DIAMETER AP (cm) DIAMETER TRANSVERSE (cm) VELOCITIES (cm/sec)  Aorta 4.56 5.04 59  Right Common Iliac 1.56 1.63 87  Left Common Iliac 1.34 1.34 82    Comparison Study       Date DIAMETER AP (cm) DIAMETER TRANSVERSE (cm)  06/20/2013 5.02 5.31     ADDITIONAL FINDINGS:     IMPRESSION: Technically difficult and limited study due to excessive bowel gas present. Abdominal Aortic Sac measures 4.56cm AP x 5.04cm TRV, no extraluminal color or spectral Doppler flow obtained.    Compared to the previous exam:  Decrease since previous study on 06/20/2013.     Medical Decision Making  Joseph Contreras is a 73 y.o. male who presents s/p EVAR (Date: 11/18/2011).  Pt is asymptomatic with a decrease in sac size. Today's EVAR Duplex  Is technically difficult and limited study due to excessive bowel gas present. Abdominal Aortic Sac measures 4.56cm AP x 5.04cm TRV, no extraluminal color or spectral Doppler flow obtained.  I discussed with the patient the importance of surveillance of the endograft.  The next endograft duplex will be scheduled for 6 months.  The patient will follow up with [place] in 6 months with these studies.  I emphasized the importance of maximal medical management including strict control of blood pressure, blood glucose, and lipid levels, antiplatelet agents, obtaining regular exercise, and cessation of smoking.   The patient was given information about AAA  including signs, symptoms, treatment, and how to minimize the risk of enlargement and rupture of aneurysms.    Thank you for allowing [place] to participate in this patient's care.  Clemon Chambers, RN, MSN, FNP-C [facility] [place] Office: [phone]  Clinic Physician: Scot Dock  09/04/2014, 9:36 AM

## 2014-09-17 ENCOUNTER — Other Ambulatory Visit (HOSPITAL_COMMUNITY): Payer: Self-pay | Admitting: Cardiovascular Disease

## 2014-09-17 ENCOUNTER — Encounter (HOSPITAL_COMMUNITY): Payer: Self-pay | Admitting: *Deleted

## 2014-09-17 DIAGNOSIS — I701 Atherosclerosis of renal artery: Secondary | ICD-10-CM

## 2014-10-16 ENCOUNTER — Ambulatory Visit (HOSPITAL_COMMUNITY)
Admission: RE | Admit: 2014-10-16 | Discharge: 2014-10-16 | Disposition: A | Discharge status: Home / Self Care | Payer: Medicare Other | Source: Ambulatory Visit | Attending: Cardiovascular Disease | Admitting: Cardiovascular Disease

## 2014-10-16 DIAGNOSIS — I701 Atherosclerosis of renal artery: Secondary | ICD-10-CM | POA: Diagnosis not present

## 2014-10-16 DIAGNOSIS — I714 Abdominal aortic aneurysm, without rupture: Secondary | ICD-10-CM | POA: Diagnosis not present

## 2014-10-16 NOTE — Progress Notes (Signed)
Renal Duplex Completed. Joseph Contreras, BS, RDMS, RVT  

## 2014-10-23 ENCOUNTER — Encounter: Payer: Self-pay | Admitting: *Deleted

## 2014-12-03 DIAGNOSIS — H25811 Combined forms of age-related cataract, right eye: Secondary | ICD-10-CM | POA: Diagnosis not present

## 2014-12-31 DIAGNOSIS — Z87891 Personal history of nicotine dependence: Secondary | ICD-10-CM | POA: Diagnosis not present

## 2014-12-31 DIAGNOSIS — I2510 Atherosclerotic heart disease of native coronary artery without angina pectoris: Secondary | ICD-10-CM | POA: Diagnosis not present

## 2014-12-31 DIAGNOSIS — H25811 Combined forms of age-related cataract, right eye: Secondary | ICD-10-CM | POA: Diagnosis not present

## 2014-12-31 DIAGNOSIS — Z79899 Other long term (current) drug therapy: Secondary | ICD-10-CM | POA: Diagnosis not present

## 2014-12-31 DIAGNOSIS — E785 Hyperlipidemia, unspecified: Secondary | ICD-10-CM | POA: Diagnosis not present

## 2014-12-31 DIAGNOSIS — H259 Unspecified age-related cataract: Secondary | ICD-10-CM | POA: Diagnosis not present

## 2014-12-31 DIAGNOSIS — I10 Essential (primary) hypertension: Secondary | ICD-10-CM | POA: Diagnosis not present

## 2014-12-31 DIAGNOSIS — E119 Type 2 diabetes mellitus without complications: Secondary | ICD-10-CM | POA: Diagnosis not present

## 2014-12-31 HISTORY — PX: EYE SURGERY: SHX253

## 2015-01-19 HISTORY — PX: EYE SURGERY: SHX253

## 2015-01-28 DIAGNOSIS — I509 Heart failure, unspecified: Secondary | ICD-10-CM | POA: Diagnosis not present

## 2015-01-28 DIAGNOSIS — Z9861 Coronary angioplasty status: Secondary | ICD-10-CM | POA: Diagnosis not present

## 2015-01-28 DIAGNOSIS — E785 Hyperlipidemia, unspecified: Secondary | ICD-10-CM | POA: Diagnosis not present

## 2015-01-28 DIAGNOSIS — Z7982 Long term (current) use of aspirin: Secondary | ICD-10-CM | POA: Diagnosis not present

## 2015-01-28 DIAGNOSIS — H25812 Combined forms of age-related cataract, left eye: Secondary | ICD-10-CM | POA: Diagnosis not present

## 2015-01-28 DIAGNOSIS — H259 Unspecified age-related cataract: Secondary | ICD-10-CM | POA: Diagnosis not present

## 2015-01-28 DIAGNOSIS — I10 Essential (primary) hypertension: Secondary | ICD-10-CM | POA: Diagnosis not present

## 2015-01-28 DIAGNOSIS — Z79899 Other long term (current) drug therapy: Secondary | ICD-10-CM | POA: Diagnosis not present

## 2015-01-28 DIAGNOSIS — E119 Type 2 diabetes mellitus without complications: Secondary | ICD-10-CM | POA: Diagnosis not present

## 2015-01-28 DIAGNOSIS — Z87891 Personal history of nicotine dependence: Secondary | ICD-10-CM | POA: Diagnosis not present

## 2015-03-03 ENCOUNTER — Encounter: Payer: Self-pay | Admitting: Family

## 2015-03-05 ENCOUNTER — Other Ambulatory Visit: Payer: Self-pay | Admitting: Family

## 2015-03-05 DIAGNOSIS — Z48812 Encounter for surgical aftercare following surgery on the circulatory system: Secondary | ICD-10-CM

## 2015-03-05 DIAGNOSIS — I714 Abdominal aortic aneurysm, without rupture, unspecified: Secondary | ICD-10-CM

## 2015-03-06 ENCOUNTER — Ambulatory Visit (INDEPENDENT_AMBULATORY_CARE_PROVIDER_SITE_OTHER): Payer: Medicare Other | Admitting: Family

## 2015-03-06 ENCOUNTER — Encounter: Payer: Self-pay | Admitting: Family

## 2015-03-06 ENCOUNTER — Ambulatory Visit (HOSPITAL_COMMUNITY)
Admission: RE | Admit: 2015-03-06 | Discharge: 2015-03-06 | Disposition: A | Discharge status: Home / Self Care | Payer: Medicare Other | Source: Ambulatory Visit | Attending: Family | Admitting: Family

## 2015-03-06 VITALS — BP 109/71 | HR 52 | Resp 16 | Ht 69.0 in | Wt 208.0 lb

## 2015-03-06 DIAGNOSIS — Z4889 Encounter for other specified surgical aftercare: Secondary | ICD-10-CM | POA: Diagnosis not present

## 2015-03-06 DIAGNOSIS — I701 Atherosclerosis of renal artery: Secondary | ICD-10-CM

## 2015-03-06 DIAGNOSIS — Z95828 Presence of other vascular implants and grafts: Secondary | ICD-10-CM

## 2015-03-06 DIAGNOSIS — Z48812 Encounter for surgical aftercare following surgery on the circulatory system: Secondary | ICD-10-CM

## 2015-03-06 DIAGNOSIS — I714 Abdominal aortic aneurysm, without rupture, unspecified: Secondary | ICD-10-CM

## 2015-03-06 NOTE — Patient Instructions (Signed)
Abdominal Aortic Aneurysm An aneurysm is a weakened or damaged part of an artery wall that bulges from the normal force of blood pumping through the body. An abdominal aortic aneurysm is an aneurysm that occurs in the lower part of the aorta, the main artery of the body.  The major concern with an abdominal aortic aneurysm is that it can enlarge and burst (rupture) or blood can flow between the layers of the wall of the aorta through a tear (aorticdissection). Both of these conditions can cause bleeding inside the body and can be life threatening unless diagnosed and treated promptly. CAUSES  The exact cause of an abdominal aortic aneurysm is unknown. Some contributing factors are:   A hardening of the arteries caused by the buildup of fat and other substances in the lining of a blood vessel (arteriosclerosis).  Inflammation of the walls of an artery (arteritis).   Connective tissue diseases, such as Marfan syndrome.   Abdominal trauma.   An infection, such as syphilis or staphylococcus, in the wall of the aorta (infectious aortitis) caused by bacteria. RISK FACTORS  Risk factors that contribute to an abdominal aortic aneurysm may include:  Age older than 60 years.   High blood pressure (hypertension).  Male gender.  Ethnicity (white race).  Obesity.  Family history of aneurysm (first degree relatives only).  Tobacco use. PREVENTION  The following healthy lifestyle habits may help decrease your risk of abdominal aortic aneurysm:  Quitting smoking. Smoking can raise your blood pressure and cause arteriosclerosis.  Limiting or avoiding alcohol.  Keeping your blood pressure, blood sugar level, and cholesterol levels within normal limits.  Decreasing your salt intake. In somepeople, too much salt can raise blood pressure and increase your risk of abdominal aortic aneurysm.  Eating a diet low in saturated fats and cholesterol.  Increasing your fiber intake by including  whole grains, vegetables, and fruits in your diet. Eating these foods may help lower blood pressure.  Maintaining a healthy weight.  Staying physically active and exercising regularly. SYMPTOMS  The symptoms of abdominal aortic aneurysm may vary depending on the size and rate of growth of the aneurysm.Most grow slowly and do not have any symptoms. When symptoms do occur, they may include:  Pain (abdomen, side, lower back, or groin). The pain may vary in intensity. A sudden onset of severe pain may indicate that the aneurysm has ruptured.  Feeling full after eating only small amounts of food.  Nausea or vomiting or both.  Feeling a pulsating lump in the abdomen.  Feeling faint or passing out. DIAGNOSIS  Since most unruptured abdominal aortic aneurysms have no symptoms, they are often discovered during diagnostic exams for other conditions. An aneurysm may be found during the following procedures:  Ultrasonography (A one-time screening for abdominal aortic aneurysm by ultrasonography is also recommended for all men aged 65-75 years who have ever smoked).  X-ray exams.  A computed tomography (CT).  Magnetic resonance imaging (MRI).  Angiography or arteriography. TREATMENT  Treatment of an abdominal aortic aneurysm depends on the size of your aneurysm, your age, and risk factors for rupture. Medication to control blood pressure and pain may be used to manage aneurysms smaller than 6 cm. Regular monitoring for enlargement may be recommended by your caregiver if:  The aneurysm is 3-4 cm in size (an annual ultrasonography may be recommended).  The aneurysm is 4-4.5 cm in size (an ultrasonography every 6 months may be recommended).  The aneurysm is larger than 4.5 cm in   size (your caregiver may ask that you be examined by a vascular surgeon). If your aneurysm is larger than 6 cm, surgical repair may be recommended. There are two main methods for repair of an aneurysm:   Endovascular  repair (a minimally invasive surgery). This is done most often.  Open repair. This method is used if an endovascular repair is not possible. Document Released: 06/16/2005 Document Revised: 01/01/2013 Document Reviewed: 10/06/2012 Exit[employer] Patient Information 2015 [employer]. This information is not intended to replace advice given to you by your health care provider. Make sure you discuss any questions you have with your health care provider.  

## 2015-03-06 NOTE — Addendum Note (Signed)
Addended by: Mena Goes on: 03/06/2015 02:48 PM   Modules accepted: Orders

## 2015-03-06 NOTE — Progress Notes (Signed)
[facility]   Established EVAR  History of Present Illness  Joseph Contreras is a 74 y.o. (29-Jan-1941) male patient of Dr. Trula Slade. The patient is here today for followup. He is status post endovascular aneurysm repair on 11/18/2011. His initial CT scan showed complete exclusion of the aneurysm without evidence of endoleak. 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 Pt states Dr. Trula Slade told him that he will be evaluated every six months re the EVAR. The patient has denies back or abdominal pain. Pt denies any history of stroke or TIA, denies claudication symptoms in legs with walking, denies non healing wounds. Pt reports that he has left leg sciatic pain if he stands more than 3 hours, but this has not bothered him in a long time; states he is physically active, is using a fit bit.  The pt denies non healing wounds. His PCP is the [facility] in [place], saw his PCP this week. Pt states Dr. Gwenlyn Found is following his renal artery stent.  Pt takes a daily statin, ASA, and no anticoagulants.  Pt Diabetic: Yes, pt does not check his home serum glucose Pt smoker: former smoker, quit in 1968    Past Medical History  Diagnosis Date  . Hypertension   . Hypercholesterolemia   . Gout   . Cancer     melanoma, squesmous cell hand, back  . Kidney atrophy     left, known occluded LRA stent  . Gout   . AAA (abdominal aortic aneurysm) 10/29/11    endovascular stent graft  . 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 Normal coronary arteries 1996    low risk Myoview Jan 2013   Past Surgical History  Procedure Laterality Date  . Left renal stent  1996    now occluded  . Cardiac catheterization  1995    normal cornaries  . Vasectomy  09/1988  . Ganglion cyst excision  79    rt wrist  . Tonsillectomy  1949  . Eye surgery  2.5.13    retinal detachment    . Abdominal aortic endovascular stent graft  Feb 2013   Social  History History  Substance Use Topics  . Smoking status: Former Smoker -- 1.00 packs/day    Quit date: 10/05/1966  . Smokeless tobacco: Never Used  . Alcohol Use: No   Family History Family History  Problem Relation Age of Onset  . Heart disease Father   . Heart attack Father   . Diabetes Mother   . Hyperlipidemia Sister   . Hypertension Sister   . Hyperlipidemia Brother   . Hypertension Brother    Current Outpatient Prescriptions on File Prior to Visit  Medication Sig Dispense Refill  . aspirin 325 MG tablet Take 325 mg by mouth daily.    . benazepril (LOTENSIN) 20 MG tablet Take 10 mg by mouth daily.     . bumetanide (BUMEX) 0.5 MG tablet Take 0.5 mg by mouth daily.    . Cholecalciferol (VITAMIN D3) 1000 UNITS CAPS Take 2,000 capsules by mouth daily.     . fish oil-omega-3 fatty acids 1000 MG capsule Take 3 g by mouth daily.    . Multiple Vitamin (MULTIVITAMIN) tablet Take 1 tablet by mouth daily.    Marland Kitchen NIACIN PO Take by mouth daily.    . Potassium 99 MG TABS Take 99 mg by mouth 2 (two) times daily.     . simvastatin (ZOCOR)  20 MG tablet Take 20 mg by mouth daily.     No current facility-administered medications on file prior to visit.   Allergies  Allergen Reactions  . Codeine Nausea And Vomiting     ROS: See HPI for pertinent positives and negatives.  Physical Examination  Filed Vitals:   03/06/15 0929  BP: 109/71  Pulse: 52  Resp: 16  Height: 5\' 9"  (1.753 m)  Weight: 208 lb (94.348 kg)  SpO2: 96%   Body mass index is 30.7 kg/(m^2).   General: A&O x 3, WD, obese male.  Pulmonary: Sym exp, good air movt, CTAB, no rales, rhonchi, or wheezing.   Cardiac: RRR, Nl S1, S2, no murmur detected.  Vascular: Vessel Right Left  Radial 2+Palpable 2+Palpable  Carotid  without bruit without bruit  Aorta Not palpable N/A  Femoral 2+ palpable 2+ palpable  Popliteal Not palpable Not palpable  PT 2+Palpable 2+ Palpable  DP  1+Palpable 1+Palpable   Gastrointestinal: soft, NTND, -G/R, - HSM, - palpable masses, - CVAT B.  Musculoskeletal: M/S 5/5 throughout, Extremities without ischemic changes  Neurologic: Pain and light touch intact in extremities, CN 2-12 intact, Motor exam as listed above        Non-Invasive Vascular Imaging  EVAR Duplex (Date: 03/06/2015) ABDOMINAL AORTA DUPLEX EVALUATION - POST ENDOVASCULAR REPAIR    INDICATION: Abdominal Aortic Aneurysm    PREVIOUS INTERVENTION(S): Endovascular Aortic Repair performed 10/29/2011.    DUPLEX EXAM:      DIAMETER AP (cm) DIAMETER TRANSVERSE (cm) VELOCITIES (cm/sec)  Aorta 5.02 4.50 68  Right Common Iliac 1.69 1.76 90  Left Common Iliac 1.67 1.65 76    Comparison Study       Date DIAMETER AP (cm) DIAMETER TRANSVERSE (cm)  09/04/2014 4.5cm 4.5cm     ADDITIONAL FINDINGS:     IMPRESSION: Patent endovascular aortic stent without hemodynamically significant changes present.    Compared to the previous exam:  Slight increase in abdominal aortic sac diameters since previous study on 09/04/2014.      Medical Decision Making  Joseph Contreras is a 74 y.o. male who presents s/p EVAR (Date: 11/18/2011).  Pt is asymptomatic with slight increase in sac size. I discussed today's Duplex results with Dr. Scot Dock; last CTA was in 2013.   I discussed with the patient the importance of surveillance of the endograft.  The next CTA will be scheduled for 3 months.  The patient will follow up with Dr. Trula Slade in 3 months with these studies.  I emphasized the importance of maximal medical management including strict control of blood pressure, blood glucose, and lipid levels, antiplatelet agents, obtaining regular exercise, and cessation of smoking.   The patient was given information about AAA including signs, symptoms, treatment, and how to minimize the risk of enlargement and rupture of aneurysms.    Thank you for allowing [place] to participate in this  patient's care.  Joseph Chambers, RN, MSN, FNP-C [facility] [place] Office: [phone]  Clinic Physician: Scot Dock  03/06/2015, 9:31 AM

## 2015-03-06 NOTE — Addendum Note (Signed)
Addended by: Dorthula Rue L on: 03/06/2015 11:41 AM   Modules accepted: Orders

## 2015-06-06 ENCOUNTER — Other Ambulatory Visit: Payer: Self-pay | Admitting: *Deleted

## 2015-06-06 DIAGNOSIS — Z01812 Encounter for preprocedural laboratory examination: Secondary | ICD-10-CM

## 2015-06-09 ENCOUNTER — Ambulatory Visit
Admission: RE | Admit: 2015-06-09 | Discharge: 2015-06-09 | Disposition: A | Discharge status: Home / Self Care | Payer: Medicare Other | Source: Ambulatory Visit | Attending: Family | Admitting: Family

## 2015-06-09 DIAGNOSIS — Z95828 Presence of other vascular implants and grafts: Secondary | ICD-10-CM

## 2015-06-09 DIAGNOSIS — I714 Abdominal aortic aneurysm, without rupture, unspecified: Secondary | ICD-10-CM

## 2015-06-09 DIAGNOSIS — I723 Aneurysm of iliac artery: Secondary | ICD-10-CM | POA: Diagnosis not present

## 2015-06-09 MED ORDER — IOPAMIDOL (ISOVUE-370) INJECTION 76%
75.0000 mL | Freq: Once | INTRAVENOUS | Status: AC | PRN
  Administered 2015-06-09: 75 mL via INTRAVENOUS

## 2015-06-11 ENCOUNTER — Encounter: Payer: Self-pay | Admitting: Surgery

## 2015-06-16 ENCOUNTER — Encounter: Payer: Self-pay | Admitting: Surgery

## 2015-06-16 ENCOUNTER — Ambulatory Visit (INDEPENDENT_AMBULATORY_CARE_PROVIDER_SITE_OTHER): Payer: Medicare Other | Admitting: Surgery

## 2015-06-16 VITALS — BP 118/62 | HR 49 | Temp 98.4°F | Ht 69.0 in | Wt 200.0 lb

## 2015-06-16 DIAGNOSIS — I701 Atherosclerosis of renal artery: Secondary | ICD-10-CM

## 2015-06-16 DIAGNOSIS — I714 Abdominal aortic aneurysm, without rupture, unspecified: Secondary | ICD-10-CM

## 2015-06-16 NOTE — Progress Notes (Signed)
Patient name: Joseph Contreras MRN: [id number] DOB: 1941-03-11 Sex: male     Chief Complaint  Patient presents with  . Re-evaluation    3 month f/u     HISTORY OF PRESENT ILLNESS:  the patient is back for follow-up.  He is status post endovascular aneurysm repair on 11/18/2011. His most recent ultrasound suggested a slight increase in size, and therefore he was sent for a CT scan for further evaluation.  He has no other complaints today.  He has lost weight since I last saw him. This is at the request of his primary care doctor given that he is a borderline diabetic.  Past Medical History  Diagnosis Date  . Hypertension   . Hypercholesterolemia   . Gout   . Cancer     melanoma, squesmous cell hand, back  . Kidney atrophy     left, known occluded LRA stent  . Gout   . AAA (abdominal aortic aneurysm) 10/29/11    endovascular stent graft  . 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 Normal coronary arteries 1996    low risk Myoview Jan 2013    Past Surgical History  Procedure Laterality Date  . Left renal stent  1996    now occluded  . Cardiac catheterization  1995    normal cornaries  . Vasectomy  09/1988  . Ganglion cyst excision  79    rt wrist  . Tonsillectomy  1949  . Abdominal aortic endovascular stent graft  Feb 2013  . Eye surgery  2.5.13    retinal detachment    . Eye surgery Right December 31, 2014    Cataract  . Eye surgery Left Jan 19, 2015    Cataract    Social History   Social History  . Marital Status: Married    Spouse Name: N/A  . Number of Children: N/A  . Years of Education: N/A   Occupational History  . Not on file.   Social History Main Topics  . Smoking status: Former Smoker -- 1.00 packs/day    Quit date: 10/05/1966  . Smokeless tobacco: Never Used  . Alcohol Use: No  . Drug Use: No  . Sexual Activity: Not on file   Other Topics Concern  . Not on file   Social History Narrative    Family History    Problem Relation Age of Onset  . Heart disease Father   . Heart attack Father   . Diabetes Mother   . Hyperlipidemia Sister   . Hypertension Sister   . Hyperlipidemia Brother   . Hypertension Brother     Allergies as of 06/16/2015 - Review Complete 06/16/2015  Allergen Reaction Noted  . Codeine Nausea And Vomiting 10/06/2011    Current Outpatient Prescriptions on File Prior to Visit  Medication Sig Dispense Refill  . aspirin 325 MG tablet Take 325 mg by mouth daily.    . benazepril (LOTENSIN) 20 MG tablet Take 10 mg by mouth daily.     . bumetanide (BUMEX) 0.5 MG tablet Take 0.5 mg by mouth daily.    . Cholecalciferol (VITAMIN D3) 1000 UNITS CAPS Take 5,000 capsules by mouth daily.     . fish oil-omega-3 fatty acids 1000 MG capsule Take 3 g by mouth daily.    . Multiple Vitamin (MULTIVITAMIN) tablet Take 1 tablet by mouth daily.    . Potassium 99 MG TABS Take 595 mg by mouth  2 (two) times daily.     . simvastatin (ZOCOR) 20 MG tablet Take 20 mg by mouth daily.    Marland Kitchen NIACIN PO Take by mouth daily.     No current facility-administered medications on file prior to visit.     REVIEW OF SYSTEMS:  see history of present illness for pertinent positives and negatives.  Otherwise negative  PHYSICAL EXAMINATION:   Vital signs are  Filed Vitals:   06/16/15 1204  BP: 118/62  Pulse: 49  Temp: 98.4 F (36.9 C)  TempSrc: Oral  Height: 5\' 9"  (1.753 m)  Weight: 200 lb (90.719 kg)  SpO2: 96%   Body mass index is 29.52 kg/(m^2). General: The patient appears their stated age. HEENT:  No gross abnormalities Pulmonary:  Non labored breathing Abdomen: Soft and non-tender Musculoskeletal: There are no major deformities. Neurologic: No focal weakness or paresthesias are detected, Skin: There are no ulcer or rashes noted. Psychiatric: The patient has normal affect. Cardiovascular: There is a regular rate and rhythm without significant murmur appreciated.   Diagnostic Studies  I  have reviewed his CT scan which shows a patent stent graft with a maximum diameter of the aorta being 5.1 cm.  Previously, it was 5.7 cm.  There is no evidence of endoleak.  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  There is no evidence of endoleak, and the aneurysm sac continue to decrease in size.  I will need to monitor the right common iliac artery on future studies given that it is now increased up to 2.5 cm.  He will follow-up again in 1 year with a CT scan.  Eldridge Abrahams, M.D. [facility] Office: [phone] Pager:  [phone]

## 2015-09-02 DIAGNOSIS — H26493 Other secondary cataract, bilateral: Secondary | ICD-10-CM | POA: Diagnosis not present

## 2015-09-08 ENCOUNTER — Ambulatory Visit: Payer: Medicare Other | Admitting: Family

## 2015-09-08 ENCOUNTER — Other Ambulatory Visit (HOSPITAL_COMMUNITY): Payer: Medicare Other

## 2016-05-18 ENCOUNTER — Encounter: Payer: Self-pay | Admitting: Cardiovascular Disease

## 2016-05-18 ENCOUNTER — Telehealth: Payer: Self-pay | Admitting: Cardiovascular Disease

## 2016-05-18 NOTE — Telephone Encounter (Signed)
Closed Encounter  °

## 2016-06-01 ENCOUNTER — Ambulatory Visit: Payer: Medicare Other | Admitting: Cardiovascular Disease

## 2016-06-04 DIAGNOSIS — Z1389 Encounter for screening for other disorder: Secondary | ICD-10-CM | POA: Diagnosis not present

## 2016-06-04 DIAGNOSIS — E782 Mixed hyperlipidemia: Secondary | ICD-10-CM | POA: Diagnosis not present

## 2016-06-04 DIAGNOSIS — E119 Type 2 diabetes mellitus without complications: Secondary | ICD-10-CM | POA: Diagnosis not present

## 2016-06-04 DIAGNOSIS — Z23 Encounter for immunization: Secondary | ICD-10-CM | POA: Diagnosis not present

## 2016-06-09 ENCOUNTER — Ambulatory Visit (INDEPENDENT_AMBULATORY_CARE_PROVIDER_SITE_OTHER): Payer: Medicare Other | Admitting: Cardiovascular Disease

## 2016-06-09 ENCOUNTER — Encounter: Payer: Self-pay | Admitting: Cardiovascular Disease

## 2016-06-09 VITALS — BP 106/62 | HR 55 | Ht 69.0 in | Wt 206.6 lb

## 2016-06-09 DIAGNOSIS — I701 Atherosclerosis of renal artery: Secondary | ICD-10-CM | POA: Diagnosis not present

## 2016-06-09 DIAGNOSIS — I714 Abdominal aortic aneurysm, without rupture, unspecified: Secondary | ICD-10-CM

## 2016-06-09 DIAGNOSIS — E7800 Pure hypercholesterolemia, unspecified: Secondary | ICD-10-CM | POA: Diagnosis not present

## 2016-06-09 DIAGNOSIS — I10 Essential (primary) hypertension: Secondary | ICD-10-CM | POA: Diagnosis not present

## 2016-06-09 NOTE — Patient Instructions (Signed)
Medication Instructions:  NO CHANGES.   Follow-Up: Your physician wants you to follow-up in: 12 MONTHS WITH DR BERRY.  You will receive a reminder letter in the mail two months in advance. If you don't receive a letter, please call our office to schedule the follow-up appointment.   If you need a refill on your cardiac medications before your next appointment, please call your pharmacy.   

## 2016-06-09 NOTE — Progress Notes (Signed)
06/09/2016 KRYSTAL MURATALLA   07/12/1941  [id number]  Primary Physician Ann Held, MD Primary Cardiologist: Lorretta Harp MD Lupe Carney, [place]  HPI:  The patient is a very pleasant 75 year old mildly overweight married Caucasian male, father of 73 and grandfather of 5 grandchildren, whom I last saw 10/30/13 He is accompanied by his wife today. I performed PTA and stenting of his left renal artery back in April 1996. His other problems include hypertension and hyperlipidemia. We have been following his renal Doppler studies which have since shown occlusion of his left renal artery approximately 7 years ago with a patent right renal artery. We have also been following his abdominal aorta, which had grown to 5.5 cm. I referred him to Dr. Annamarie Major, who performed abdominal stent grafting on him February of last year. He had a negative Myoview as part of that. He denies chest pain or shortness of breath pain or shortness of breath. His labwork is followed at the [facility]. He does admit to dietary indiscretion and has issues with hypertriglyceridemia.   Current Outpatient Prescriptions  Medication Sig Dispense Refill  . aspirin 325 MG tablet Take 325 mg by mouth daily.    . benazepril (LOTENSIN) 20 MG tablet Take 10 mg by mouth daily.     . bumetanide (BUMEX) 0.5 MG tablet Take 0.5 mg by mouth daily.    . Cholecalciferol (VITAMIN D3) 1000 UNITS CAPS Take 5,000 capsules by mouth daily.     . fish oil-omega-3 fatty acids 1000 MG capsule Take 3 g by mouth daily.    . Multiple Vitamin (MULTIVITAMIN) tablet Take 1 tablet by mouth daily.    . Potassium 99 MG TABS Take 595 mg by mouth 2 (two) times daily.     . simvastatin (ZOCOR) 20 MG tablet Take 20 mg by mouth daily.     No current facility-administered medications for this visit.     Allergies  Allergen Reactions  . Codeine Nausea And Vomiting    Social History   Social History  . Marital status: Married    Spouse  name: N/A  . Number of children: N/A  . Years of education: N/A   Occupational History  . Not on file.   Social History Main Topics  . Smoking status: Former Smoker    Packs/day: 1.00    Quit date: 10/05/1966  . Smokeless tobacco: Never Used  . Alcohol use No  . Drug use: No  . Sexual activity: Not on file   Other Topics Concern  . Not on file   Social History Narrative  . No narrative on file     Review of Systems: General: negative for chills, fever, night sweats or weight changes.  Cardiovascular: negative for chest pain, dyspnea on exertion, edema, orthopnea, palpitations, paroxysmal nocturnal dyspnea or shortness of breath Dermatological: negative for rash Respiratory: negative for cough or wheezing Urologic: negative for hematuria Abdominal: negative for nausea, vomiting, diarrhea, bright red blood per rectum, melena, or hematemesis Neurologic: negative for visual changes, syncope, or dizziness All other systems reviewed and are otherwise negative except as noted above.    Blood pressure 106/62, pulse (!) 55, height 5\' 9"  (1.753 m), weight 206 lb 9.6 oz (93.7 kg), SpO2 97 %.  General appearance: alert and no distress Neck: no adenopathy, no carotid bruit, no JVD, supple, symmetrical, trachea midline and thyroid not enlarged, symmetric, no tenderness/mass/nodules Lungs: clear to auscultation bilaterally Heart: regular rate and rhythm, S1, S2  normal, no murmur, click, rub or gallop Extremities: extremities normal, atraumatic, no cyanosis or edema  EKG sinus bradycardia 55 without ST or T-wave changes. I personally reviewed this EKG  ASSESSMENT AND PLAN:   Hypercholesterolemia History of hypercholesterolemia and hypertriglyceridemia on fish oil and statin drugs followed by the [facility] I suspect the majority of this abnormality is dietary indiscretion  AAA (abdominal aortic aneurysm)- S/P Ao stent graft 2/13 History of abdominal aortic aneurysm treated  with endoluminal stent grafting by Dr. Trula Slade February 2013 which he follows non-invasively on a regular basis  Renal artery stenosis- remote LRA stent, known to be occluded now History of renal artery stenosis status post stenting by myself in 1996. This has been demonstrated to be occluded in the past.  Essential hypertension History of hypertension blood pressure measured at 106/62. He is on Lotensin. Continue current meds at current dosing      Lorretta Harp MD [facility], [facility] 06/09/2016 2:13 PM

## 2016-06-09 NOTE — Assessment & Plan Note (Signed)
History of abdominal aortic aneurysm treated with endoluminal stent grafting by Dr. Trula Slade February 2013 which he follows non-invasively on a regular basis

## 2016-06-09 NOTE — Assessment & Plan Note (Signed)
History of hypertension blood pressure measured at 106/62. He is on Lotensin. Continue current meds at current dosing

## 2016-06-09 NOTE — Assessment & Plan Note (Signed)
History of hypercholesterolemia and hypertriglyceridemia on fish oil and statin drugs followed by the [facility] I suspect the majority of this abnormality is dietary indiscretion

## 2016-06-09 NOTE — Assessment & Plan Note (Signed)
History of renal artery stenosis status post stenting by myself in 1996. This has been demonstrated to be occluded in the past.

## 2016-07-15 ENCOUNTER — Other Ambulatory Visit: Payer: Self-pay | Admitting: *Deleted

## 2016-07-15 DIAGNOSIS — I714 Abdominal aortic aneurysm, without rupture, unspecified: Secondary | ICD-10-CM

## 2016-07-21 ENCOUNTER — Other Ambulatory Visit: Payer: Medicare Other

## 2016-08-23 ENCOUNTER — Ambulatory Visit
Admission: RE | Admit: 2016-08-23 | Discharge: 2016-08-23 | Disposition: A | Discharge status: Home / Self Care | Payer: Medicare Other | Source: Ambulatory Visit | Attending: Surgery | Admitting: Surgery

## 2016-08-23 DIAGNOSIS — I714 Abdominal aortic aneurysm, without rupture, unspecified: Secondary | ICD-10-CM

## 2016-08-23 MED ORDER — IOPAMIDOL (ISOVUE-370) INJECTION 76%
100.0000 mL | Freq: Once | INTRAVENOUS | Status: AC | PRN
  Administered 2016-08-23: 100 mL via INTRAVENOUS

## 2016-08-24 ENCOUNTER — Encounter (HOSPITAL_COMMUNITY): Payer: Self-pay

## 2016-08-24 ENCOUNTER — Emergency Department (HOSPITAL_COMMUNITY)
Admission: EM | Admit: 2016-08-24 | Discharge: 2016-08-24 | Disposition: A | Discharge status: Home / Self Care | Payer: Medicare Other | Attending: Emergency Medicine | Admitting: Emergency Medicine

## 2016-08-24 DIAGNOSIS — Y939 Activity, unspecified: Secondary | ICD-10-CM | POA: Insufficient documentation

## 2016-08-24 DIAGNOSIS — R55 Syncope and collapse: Secondary | ICD-10-CM | POA: Diagnosis not present

## 2016-08-24 DIAGNOSIS — S6010XA Contusion of unspecified finger with damage to nail, initial encounter: Secondary | ICD-10-CM

## 2016-08-24 DIAGNOSIS — S60031A Contusion of right middle finger without damage to nail, initial encounter: Secondary | ICD-10-CM | POA: Insufficient documentation

## 2016-08-24 DIAGNOSIS — W230XXA Caught, crushed, jammed, or pinched between moving objects, initial encounter: Secondary | ICD-10-CM | POA: Diagnosis not present

## 2016-08-24 DIAGNOSIS — Z7982 Long term (current) use of aspirin: Secondary | ICD-10-CM | POA: Insufficient documentation

## 2016-08-24 DIAGNOSIS — Z85820 Personal history of malignant melanoma of skin: Secondary | ICD-10-CM | POA: Insufficient documentation

## 2016-08-24 DIAGNOSIS — R404 Transient alteration of awareness: Secondary | ICD-10-CM | POA: Diagnosis not present

## 2016-08-24 DIAGNOSIS — I509 Heart failure, unspecified: Secondary | ICD-10-CM | POA: Diagnosis not present

## 2016-08-24 DIAGNOSIS — Y92000 Kitchen of unspecified non-institutional (private) residence as  the place of occurrence of the external cause: Secondary | ICD-10-CM | POA: Insufficient documentation

## 2016-08-24 DIAGNOSIS — Z87891 Personal history of nicotine dependence: Secondary | ICD-10-CM | POA: Insufficient documentation

## 2016-08-24 DIAGNOSIS — I110 Hypertensive heart disease with heart failure: Secondary | ICD-10-CM | POA: Diagnosis not present

## 2016-08-24 DIAGNOSIS — Y999 Unspecified external cause status: Secondary | ICD-10-CM | POA: Insufficient documentation

## 2016-08-24 DIAGNOSIS — S60131A Contusion of right middle finger with damage to nail, initial encounter: Secondary | ICD-10-CM | POA: Diagnosis not present

## 2016-08-24 DIAGNOSIS — S6991XA Unspecified injury of right wrist, hand and finger(s), initial encounter: Secondary | ICD-10-CM

## 2016-08-24 LAB — BASIC METABOLIC PANEL
Anion gap: 3 — ABNORMAL LOW (ref 5–15)
BUN: 14 mg/dL (ref 6–20)
CALCIUM: 9.9 mg/dL (ref 8.9–10.3)
CO2: 30 mmol/L (ref 22–32)
CREATININE: 1.04 mg/dL (ref 0.61–1.24)
Chloride: 108 mmol/L (ref 101–111)
GFR calc Af Amer: >60 mL/min (ref >60)
GLUCOSE: 108 mg/dL — AB (ref 65–99)
Potassium: 4.4 mmol/L (ref 3.5–5.1)
Sodium: 141 mmol/L (ref 135–145)

## 2016-08-24 LAB — CBC
HCT: 44.8 % (ref 39.0–52.0)
Hemoglobin: 15 g/dL (ref 13.0–17.0)
MCH: 32 pg (ref 26.0–34.0)
MCHC: 33.5 g/dL (ref 30.0–36.0)
MCV: 95.5 fL (ref 78.0–100.0)
Platelets: 145 10*3/uL — ABNORMAL LOW (ref 150–400)
RBC: 4.69 MIL/uL (ref 4.22–5.81)
RDW: 14.1 % (ref 11.5–15.5)
WBC: 10 10*3/uL (ref 4.0–10.5)

## 2016-08-24 NOTE — ED Notes (Signed)
PT is in stable condition upon d/c and ambulates from ED. 

## 2016-08-24 NOTE — ED Provider Notes (Signed)
[facility] DEPT Provider Note   CSN: [id number] Arrival date & time: 08/24/16  1143   History   Chief Complaint Chief Complaint  Patient presents with  . Finger Injury    right middle finger.  . Dizziness    HPI Joseph Contreras is a 75 y.o. male.  Patient with PMH of AAA s/o stent graft, HDL, Gout, HTN and h/o CHF presenting to [facility] after pre-syncopal episode. Patient states that he was in the process of making breakfast morning of visit, when he closed his finger in a kitchen drawer. He continued working in the kitchen for 3-4 more minutes then began feeling dizzy, weak and sweaty. Patient then sat down in chair. Patient's wife was trying to speak with the patient but he was not responding. She states that he was very clammy looking, but denies that he passed out, has facial drooping or speech slurring. Wife then called EMS for evaluation. Patient then came to and was able to communicate fully, had awareness of situation, had some residual overall weakness but no focal weakness. Patient denies chest pain, shortness of breath, headache, vision or hearing change, palpitations, or focal weakness associated with episode.        Past Medical History:  Diagnosis Date  . AAA (abdominal aortic aneurysm) ([place]) 10/29/11   endovascular stent graft  . Cancer ([facility])    melanoma, squesmous cell hand, back  . Gout   . Gout   . Hypercholesterolemia   . Hypertension   . Kidney atrophy    left, known occluded LRA stent  . Normal coronary arteries 1996   low risk Myoview Jan 2013  . Renal artery stenosis [facility]) April 1996   LRA stent- now known to be occluded    Patient Active Problem List   Diagnosis Date Noted  . Aftercare following surgery of the circulatory system, [place] 02/25/2014  . Essential hypertension 10/30/2013  . Abdominal aneurysm without mention of rupture 08/20/2013  . Renal artery stenosis- remote LRA stent, known to be occluded now 04/09/2013  . Normal coronary arteries-  1996, low risk Myoview Jan 2013 04/09/2013  . Gout 04/09/2013  . Hematuria- negative work up at the [facility] 04/09/2013  . Agent orange exposure 04/09/2013  . AAA (abdominal aortic aneurysm)- S/P Ao stent graft 2/13 10/11/2011  . Hypercholesterolemia     Past Surgical History:  Procedure Laterality Date  . ABDOMINAL AORTIC ENDOVASCULAR STENT GRAFT  Feb 2013  . [place]   normal cornaries  . EYE SURGERY  2.5.13   retinal detachment    . EYE SURGERY Right December 31, 2014   Cataract  . EYE SURGERY Left Jan 19, 2015   Cataract  . GANGLION CYST EXCISION  79   rt wrist  . LEFT RENAL STENT  1996   now occluded  . TONSILLECTOMY  1949  . VASECTOMY  09/1988    Home Medications    Prior to Admission medications   Medication Sig Start Date End Date Taking? Authorizing Provider  aspirin 325 MG tablet Take 325 mg by mouth daily.    Historical Provider, MD  benazepril (LOTENSIN) 20 MG tablet Take 10 mg by mouth daily.     Historical Provider, MD  bumetanide (BUMEX) 0.5 MG tablet Take 0.5 mg by mouth daily.    Historical Provider, MD  Cholecalciferol (VITAMIN D3) 1000 UNITS CAPS Take 5,000 capsules by mouth daily.     Historical Provider, MD  fish oil-omega-3 fatty acids 1000 MG capsule Take  3 g by mouth daily.    Historical Provider, MD  Multiple Vitamin (MULTIVITAMIN) tablet Take 1 tablet by mouth daily.    Historical Provider, MD  Potassium 99 MG TABS Take 595 mg by mouth 2 (two) times daily.     Historical Provider, MD  simvastatin (ZOCOR) 20 MG tablet Take 20 mg by mouth daily.    Historical Provider, MD    Family History Family History  Problem Relation Age of Onset  . Heart disease Father   . Heart attack Father   . Diabetes Mother   . Hyperlipidemia Sister   . Hypertension Sister   . Hyperlipidemia Brother   . Hypertension Brother     Social History Social History  Substance Use Topics  . Smoking status: Former Smoker    Packs/day: 1.00    Quit date:  10/05/1966  . Smokeless tobacco: Never Used  . Alcohol use No     Allergies   Codeine   Review of Systems Review of Systems  Constitutional: Positive for diaphoresis. Negative for appetite change, chills and fever.  HENT: Negative for drooling, facial swelling, trouble swallowing and voice change.   Eyes: Negative for visual disturbance.  Respiratory: Negative for cough, chest tightness, shortness of breath and wheezing.   Cardiovascular: Negative for chest pain, palpitations and leg swelling.  Gastrointestinal: Negative for abdominal distention, abdominal pain, blood in stool, constipation, diarrhea, nausea and vomiting.  Endocrine: Negative.   Genitourinary: Negative for dysuria, frequency and hematuria.  Musculoskeletal: Negative for arthralgias.  Skin: Negative for rash.  Neurological: Positive for weakness and light-headedness. Negative for dizziness, tremors, seizures, syncope, facial asymmetry, speech difficulty, numbness and headaches.  Psychiatric/Behavioral: Negative for agitation, behavioral problems, confusion, decreased concentration and dysphoric mood. The patient is not nervous/anxious.      Physical Exam Updated Vital Signs BP 124/80   Pulse 62   Temp 97.9 F (36.6 C) (Oral)   Resp 21   Ht 5\' 9"  (1.753 m)   Wt 90.9 kg   SpO2 95%   BMI 29.59 kg/m   Physical Exam  Constitutional: He is oriented to person, place, and time. He appears well-developed and well-nourished. He is cooperative. No distress.  HENT:  Head: Normocephalic and atraumatic.  Mouth/Throat: Uvula is midline and oropharynx is clear and moist.  Eyes: Conjunctivae, EOM and lids are normal. Pupils are equal, round, and reactive to light.  Neck: Normal range of motion. Neck supple. No JVD present.  Cardiovascular: Normal rate, regular rhythm, S1 normal, S2 normal and intact distal pulses.  Exam reveals no gallop.   No murmur heard. Pulmonary/Chest: No accessory muscle usage. No respiratory  distress. He has no wheezes. He has no rhonchi. He has no rales.  Abdominal: Soft. Normal appearance and bowel sounds are normal. He exhibits no distension, no fluid wave and no mass. There is no tenderness.  Musculoskeletal: Normal range of motion.       Arms: Small subungual hematoma on right 3rd digit  Neurological: He is alert and oriented to person, place, and time. He has normal strength. No cranial nerve deficit or sensory deficit. GCS eye subscore is 4. GCS verbal subscore is 5. GCS motor subscore is 6.  Skin: Skin is warm and dry. Capillary refill takes less than 2 seconds. No bruising, no ecchymosis, no laceration, no lesion and no rash noted. He is not diaphoretic.  Psychiatric: He has a normal mood and affect. His speech is normal and behavior is normal. Judgment and thought content normal.  Cognition and memory are normal.     ED Treatments / Results  Labs (all labs ordered are listed, but only abnormal results are displayed) Labs Reviewed  BASIC METABOLIC PANEL - Abnormal; Notable for the following:       Result Value   Glucose, Bld 108 (*)    Anion gap 3 (*)    All other components within normal limits  CBC - Abnormal; Notable for the following:    Platelets 145 (*)    All other components within normal limits    EKG  EKG Interpretation  Date/Time:  Tuesday August 24 2016 11:49:51 EST Ventricular Rate:  59 PR Interval:    QRS Duration: 92 QT Interval:  420 QTC Calculation: 416 R Axis:   17 Text Interpretation:  Age not entered, assumed to be  75 years old for purpose of ECG interpretation Sinus rhythm nonspecific ST changes similar to 2013 Confirmed by GOLDSTON MD, [place] [id number]) on 08/24/2016 1:18:13 PM       Radiology No results found.  Procedures Procedures (including critical care time)  Medications Ordered in ED Medications - No data to display   Initial Impression / Assessment and Plan / ED Course  I have reviewed the triage vital signs and the  nursing notes.  Pertinent labs & imaging results that were available during my care of the patient were reviewed by me and considered in my medical decision making (see chart for details).  Clinical Course    HADRIEL KOLLER is a 75yo male with PMH of AAA s/o stent, HTN, HLD, presenting with near syncopal episode. Patient had no LOC or fall. VS were stable on arrival and throughout ED stay. EKG was unremarkable for arrhythmia, ST segment or T wave changes. CBC was unremarkable, BMP showed normal electrolyte levels and stable kidney function. Events and workup consistent with vasovagal pre-syncopal event after finger injury. There is a small subungual hematoma on patient's right 3rd digit that is not remarkable enough for aspiration at this time - patient has full motion and sensation of his finger without much tenderness, therefore further imaging is no indicated.   Patient was advised about strict return precautions including CP, shortness of breath, syncope and worsening of the hematoma. Patient stated understanding, and in agreement with plan to discharge home. He had no further questions or concerns at this time.   Final Clinical Impressions(s) / ED Diagnoses   Final diagnoses:  Injury of finger of right hand, initial encounter  Subungual hematoma of digit of hand, initial encounter  Near syncope    New Prescriptions Discharge Medication List as of 08/24/2016  3:14 PM       Alphonzo Grieve, MD 08/25/16 2155    Sherwood Gambler, MD 08/27/16 [phone]

## 2016-08-24 NOTE — ED Triage Notes (Signed)
BIB [facility] from home where pt reports he shut his right middle finger in a door and then felt dizzy like he "was going to pass out". No LOC, no NV, no CP. Pt reports feeling diaphoretic and weak stating to EMS that he "normally walks 7000 steps a day but feels that he cant walk 7 now". Pt states he saw his wife talking but could not respond. HR in 60's for EMS per pt. On his fit bit this is his normal.

## 2016-08-24 NOTE — Discharge Instructions (Signed)
Today we saw you for dizziness and an injury to your finger. Your symptoms were most likely due to your body's response to pain when you injured your finger. All of the blood work and your heart seemed to be doing ok.   If you develop increasing pressure and blood underneath your fingernail, please follow up with your PCP or the ED for evaluation for possible draining of the blood.

## 2016-08-25 ENCOUNTER — Encounter: Payer: Self-pay | Admitting: Surgery

## 2016-08-30 ENCOUNTER — Ambulatory Visit (INDEPENDENT_AMBULATORY_CARE_PROVIDER_SITE_OTHER): Payer: Medicare Other | Admitting: Surgery

## 2016-08-30 VITALS — BP 121/78 | HR 64 | Temp 97.3°F | Resp 16 | Ht 69.0 in | Wt 206.0 lb

## 2016-08-30 DIAGNOSIS — I714 Abdominal aortic aneurysm, without rupture, unspecified: Secondary | ICD-10-CM

## 2016-08-30 DIAGNOSIS — I701 Atherosclerosis of renal artery: Secondary | ICD-10-CM | POA: Diagnosis not present

## 2016-08-30 NOTE — Progress Notes (Signed)
[facility]ist of [place]  Patient name: Joseph Contreras MRN: [id number] DOB: 1941/03/04 Sex: male  REASON FOR VISIT: Follow-up  HPI: the patient is back for follow-up.  He is status post endovascular aneurysm repair on 11/18/2011. His most recent ultrasound suggested a slight increase in size, and therefore he was sent for a CT scan for further evaluation.  He has no other complaints today.  He has lost weight since I last saw him. This is at the request of his primary care doctor given that he is a borderline diabetic.  Was recently in the hospital for a subungual hematoma from which he passed out secondary to the pain.  No other syncopal etiology other than pain was identified.  He continues to take a statin for hypercholesterolemia and ACE inhibitor for hypertension.  Past Medical History:  Diagnosis Date  . AAA (abdominal aortic aneurysm) ([place]) 10/29/11   endovascular stent graft  . Cancer (HCC)    melanoma, squesmous cell hand, back  . Gout   . Gout   . Hypercholesterolemia   . Hypertension   . Kidney atrophy    left, known occluded LRA stent  . Normal coronary arteries 1996   low risk Myoview Jan 2013  . Renal artery stenosis [facility]) April 1996   LRA stent- now known to be occluded    Family History  Problem Relation Age of Onset  . Heart disease Father   . Heart attack Father   . Diabetes Mother   . Hyperlipidemia Sister   . Hypertension Sister   . Hyperlipidemia Brother   . Hypertension Brother     SOCIAL HISTORY: Social History  Substance Use Topics  . Smoking status: Former Smoker    Packs/day: 1.00    Quit date: 10/05/1966  . Smokeless tobacco: Never Used  . Alcohol use No    Allergies  Allergen Reactions  . Codeine Nausea And Vomiting    Current Outpatient Prescriptions  Medication Sig Dispense Refill  . aspirin 325 MG tablet Take 325 mg by mouth daily.    . benazepril (LOTENSIN) 20 MG tablet Take 10  mg by mouth daily.     . bumetanide (BUMEX) 0.5 MG tablet Take 0.5 mg by mouth daily.    . Cholecalciferol (VITAMIN D3) 1000 UNITS CAPS Take 5,000 capsules by mouth daily.     . fish oil-omega-3 fatty acids 1000 MG capsule Take 3 g by mouth daily.    . Multiple Vitamin (MULTIVITAMIN) tablet Take 1 tablet by mouth daily.    . Potassium 99 MG TABS Take 595 mg by mouth 2 (two) times daily.     . simvastatin (ZOCOR) 20 MG tablet Take 20 mg by mouth daily.     No current facility-administered medications for this visit.     REVIEW OF SYSTEMS:  [X]  denotes positive finding, [ ]  denotes negative finding Cardiac  Comments:  Chest pain or chest pressure:    Shortness of breath upon exertion:    Short of breath when lying flat:    Irregular heart rhythm:        Vascular    Pain in calf, thigh, or hip brought on by ambulation:    Pain in feet at night that wakes you up from your sleep:     Blood clot in your veins:    Leg swelling:         Pulmonary    Oxygen at home:    Productive cough:  Wheezing:         Neurologic    Sudden weakness in arms or legs:     Sudden numbness in arms or legs:     Sudden onset of difficulty speaking or slurred speech:    Temporary loss of vision in one eye:     Problems with dizziness:         Gastrointestinal    Blood in stool:     Vomited blood:         Genitourinary    Burning when urinating:     Blood in urine:        Psychiatric    Major depression:         Hematologic    Bleeding problems:    Problems with blood clotting too easily:        Skin    Rashes or ulcers:        Constitutional    Fever or chills:      PHYSICAL EXAM: There were no vitals filed for this visit.  GENERAL: The patient is a well-nourished male, in no acute distress. The vital signs are documented above. CARDIAC: There is a regular rate and rhythm.  VASCULAR: Palpable pedal pulses PULMONARY: There is good air exchange bilaterally without wheezing or  rales. ABDOMEN: Soft and non-tender with normal pitched bowel sounds.  MUSCULOSKELETAL: There are no major deformities or cyanosis. NEUROLOGIC: No focal weakness or paresthesias are detected. SKIN: There are no ulcers or rashes noted. PSYCHIATRIC: The patient has a normal affect.  DATA:  I have reviewed the CT scan with the following findings: Endovascular repair of infrarenal abdominal aortic aneurysm. There is evidence of a subtle type 2 endoleak, likely originating from right-sided lower lumbar arteries, although there has not been interval sac growth, with the greatest diameter measured today approximately 4.9 cm.  Aortic atherosclerosis and coronary artery disease.  NON-VASCULAR  Diverticular disease without evidence of acute diverticulitis.  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  MEDICAL ISSUES: AAA:  A small endoleak was identified on CT scan today.  Maximum aortic diameter is 4.9 cm, which is unchanged.  Right common iliac diameter measures 2.5 cm which is also unchanged.  I will have the patient follow-up in 1 year.  I will try to image his aorta with ultrasound, however we have had difficulty in the past.  If I'm unable to adequately visualize everything, we will need to repeat his CT angiogram.    Annamarie Major, MD [facility] [phone] Pager [phone]

## 2016-10-13 DIAGNOSIS — I10 Essential (primary) hypertension: Secondary | ICD-10-CM | POA: Diagnosis not present

## 2016-10-13 DIAGNOSIS — E559 Vitamin D deficiency, unspecified: Secondary | ICD-10-CM | POA: Diagnosis not present

## 2016-10-13 DIAGNOSIS — E669 Obesity, unspecified: Secondary | ICD-10-CM | POA: Diagnosis not present

## 2016-10-13 DIAGNOSIS — Z79899 Other long term (current) drug therapy: Secondary | ICD-10-CM | POA: Diagnosis not present

## 2016-10-13 DIAGNOSIS — E782 Mixed hyperlipidemia: Secondary | ICD-10-CM | POA: Diagnosis not present

## 2016-10-14 DIAGNOSIS — R739 Hyperglycemia, unspecified: Secondary | ICD-10-CM | POA: Diagnosis not present

## 2016-11-23 DIAGNOSIS — E782 Mixed hyperlipidemia: Secondary | ICD-10-CM | POA: Diagnosis not present

## 2016-11-23 DIAGNOSIS — Z0000 Encounter for general adult medical examination without abnormal findings: Secondary | ICD-10-CM | POA: Diagnosis not present

## 2016-11-23 DIAGNOSIS — I10 Essential (primary) hypertension: Secondary | ICD-10-CM | POA: Diagnosis not present

## 2016-11-23 DIAGNOSIS — E663 Overweight: Secondary | ICD-10-CM | POA: Diagnosis not present

## 2016-11-23 DIAGNOSIS — Z136 Encounter for screening for cardiovascular disorders: Secondary | ICD-10-CM | POA: Diagnosis not present

## 2016-11-23 DIAGNOSIS — Z125 Encounter for screening for malignant neoplasm of prostate: Secondary | ICD-10-CM | POA: Diagnosis not present

## 2017-06-15 DIAGNOSIS — F4312 Post-traumatic stress disorder, chronic: Secondary | ICD-10-CM | POA: Diagnosis not present

## 2017-06-17 ENCOUNTER — Ambulatory Visit (INDEPENDENT_AMBULATORY_CARE_PROVIDER_SITE_OTHER): Payer: Medicare Other | Admitting: Cardiovascular Disease

## 2017-06-17 ENCOUNTER — Encounter: Payer: Self-pay | Admitting: Cardiovascular Disease

## 2017-06-17 VITALS — BP 116/72 | HR 55 | Ht 68.0 in | Wt 203.0 lb

## 2017-06-17 DIAGNOSIS — I10 Essential (primary) hypertension: Secondary | ICD-10-CM

## 2017-06-17 DIAGNOSIS — I714 Abdominal aortic aneurysm, without rupture, unspecified: Secondary | ICD-10-CM

## 2017-06-17 DIAGNOSIS — I701 Atherosclerosis of renal artery: Secondary | ICD-10-CM

## 2017-06-17 DIAGNOSIS — E7800 Pure hypercholesterolemia, unspecified: Secondary | ICD-10-CM | POA: Diagnosis not present

## 2017-06-17 NOTE — Progress Notes (Signed)
06/17/2017 Joseph Contreras   Oct 13, 1940  [id number]  Primary Physician Melony Overly, MD Primary Cardiologist: Lorretta Harp MD FACP, [place], [place], [place]  HPI:  Joseph Contreras is a 76 y.o. male  mildly overweight married Caucasian male, father of 38 and grandfather of 5 grandchildren, whom I last saw 06/09/16  He is accompanied by his wife today. I performed PTA and stenting of his left renal artery back in April 1996. His other problems include hypertension and hyperlipidemia. We have been following his renal Doppler studies which have since shown occlusion of his left renal artery approximately 7 years ago with a patent right renal artery. We have also been following his abdominal aorta, which had grown to 5.5 cm. I referred him to Dr. Annamarie Major, who performed abdominal stent grafting on him February of last year. He had a negative Myoview as part of that. His labwork is followed at the [facility] which was recently performed 06/07/17 revealing a total cholesterol of 156, LDL 52 and triglyceride level of 332. He is fairly active. He denies chest pain or shortness of breath.  Current Meds  Medication Sig  . allopurinol (ZYLOPRIM) 100 MG tablet Take 100 mg by mouth daily.  Marland Kitchen aspirin 325 MG tablet Take 325 mg by mouth daily.  . benazepril (LOTENSIN) 20 MG tablet Take 10 mg by mouth daily.   . bumetanide (BUMEX) 0.5 MG tablet Take 0.5 mg by mouth daily.  . Cholecalciferol (VITAMIN D3) 5000 units CAPS Take 5,000 capsules by mouth daily.   . fish oil-omega-3 fatty acids 1000 MG capsule Take 3 g by mouth daily.  . indomethacin (INDOCIN) 50 MG capsule Take 50 mg by mouth as needed.  . Multiple Vitamin (MULTIVITAMIN) tablet Take 1 tablet by mouth daily.  . Potassium 99 MG TABS Take 595 mg by mouth 2 (two) times daily.   . simvastatin (ZOCOR) 20 MG tablet Take 20 mg by mouth daily.     Allergies  Allergen Reactions  . Codeine Nausea And Vomiting    Social History   Social History   . Marital status: Married    Spouse name: N/A  . Number of children: N/A  . Years of education: N/A   Occupational History  . Not on file.   Social History Main Topics  . Smoking status: Former Smoker    Packs/day: 1.00    Quit date: 10/05/1966  . Smokeless tobacco: Never Used  . Alcohol use No  . Drug use: No  . Sexual activity: Not on file   Other Topics Concern  . Not on file   Social History Narrative  . No narrative on file     Review of Systems: General: negative for chills, fever, night sweats or weight changes.  Cardiovascular: negative for chest pain, dyspnea on exertion, edema, orthopnea, palpitations, paroxysmal nocturnal dyspnea or shortness of breath Dermatological: negative for rash Respiratory: negative for cough or wheezing Urologic: negative for hematuria Abdominal: negative for nausea, vomiting, diarrhea, bright red blood per rectum, melena, or hematemesis Neurologic: negative for visual changes, syncope, or dizziness All other systems reviewed and are otherwise negative except as noted above.    Blood pressure 116/72, pulse (!) 55, height 5\' 8"  (1.727 m), weight 203 lb (92.1 kg).  General appearance: alert and no distress Neck: no adenopathy, no carotid bruit, no JVD, supple, symmetrical, trachea midline and thyroid not enlarged, symmetric, no tenderness/mass/nodules Lungs: clear to auscultation bilaterally Heart: regular rate and  rhythm, S1, S2 normal, no murmur, click, rub or gallop Extremities: extremities normal, atraumatic, no cyanosis or edema Pulses: 2+ and symmetric Skin: Skin color, texture, turgor normal. No rashes or lesions Neurologic: Alert and oriented X 3, normal strength and tone. Normal symmetric reflexes. Normal coordination and gait  EKG sinus bradycardia 55 without ST or T-wave changes. I personally reviewed this EKG.  ASSESSMENT AND PLAN:   Hypercholesterolemia History of hyperlipidemia on statin therapy and diet recent lipid  profile performed at the [facility] revealing an LDL of 52 with a triglyceride level of 332.  AAA (abdominal aortic aneurysm)- S/P Ao stent graft 2/13 History of abdominal aortic aneurysm status post endoluminal stent graft by Dr. Trula Slade February 2016 which he follows as an outpatient.  Renal artery stenosis- remote LRA stent, known to be occluded now History of left renal artery stenting by myself April 1996. Follow-up renal Dopplers performed 10/16/14 revealed an occluded left renal artery/nonfunctioning kidney with a widely patent right renal artery and a right renal dimension of 13.6 cm.  Essential hypertension History of essential hypertension blood pressure measures 116/72. He is on Lotensin. Continue current meds at current dosing.      Lorretta Harp MD FACP,FACC,FAHA, [facility] 06/17/2017 4:33 PM

## 2017-06-17 NOTE — Assessment & Plan Note (Signed)
History of hyperlipidemia on statin therapy and diet recent lipid profile performed at the [facility] revealing an LDL of 52 with a triglyceride level of 332.

## 2017-06-17 NOTE — Assessment & Plan Note (Signed)
History of left renal artery stenting by myself April 1996. Follow-up renal Dopplers performed 10/16/14 revealed an occluded left renal artery/nonfunctioning kidney with a widely patent right renal artery and a right renal dimension of 13.6 cm.

## 2017-06-17 NOTE — Assessment & Plan Note (Signed)
History of abdominal aortic aneurysm status post endoluminal stent graft by Dr. Trula Slade February 2016 which he follows as an outpatient.

## 2017-06-17 NOTE — Assessment & Plan Note (Signed)
History of essential hypertension blood pressure measures 116/72. He is on Lotensin. Continue current meds at current dosing.

## 2017-06-17 NOTE — Patient Instructions (Signed)

## 2017-07-01 DIAGNOSIS — Z23 Encounter for immunization: Secondary | ICD-10-CM | POA: Diagnosis not present

## 2017-07-27 DIAGNOSIS — F4312 Post-traumatic stress disorder, chronic: Secondary | ICD-10-CM | POA: Diagnosis not present

## 2017-09-05 ENCOUNTER — Ambulatory Visit (HOSPITAL_COMMUNITY)
Admission: RE | Admit: 2017-09-05 | Discharge: 2017-09-05 | Disposition: A | Discharge status: Home / Self Care | Payer: Medicare Other | Source: Ambulatory Visit | Attending: Surgery | Admitting: Surgery

## 2017-09-05 ENCOUNTER — Encounter: Payer: Self-pay | Admitting: Surgery

## 2017-09-05 ENCOUNTER — Ambulatory Visit (INDEPENDENT_AMBULATORY_CARE_PROVIDER_SITE_OTHER): Payer: Medicare Other | Admitting: Surgery

## 2017-09-05 VITALS — BP 125/76 | HR 55 | Temp 97.1°F | Resp 20 | Ht 68.0 in | Wt 200.0 lb

## 2017-09-05 DIAGNOSIS — I714 Abdominal aortic aneurysm, without rupture, unspecified: Secondary | ICD-10-CM

## 2017-09-05 DIAGNOSIS — I701 Atherosclerosis of renal artery: Secondary | ICD-10-CM | POA: Diagnosis not present

## 2017-09-05 DIAGNOSIS — F4312 Post-traumatic stress disorder, chronic: Secondary | ICD-10-CM | POA: Diagnosis not present

## 2017-09-05 NOTE — Progress Notes (Signed)
V[facility] and Vein Specialist of [place]  Patient name: Joseph Contreras MRN: [id number] DOB: 02-03-1941 Sex: male   REASON FOR VISIT:    F/u AAA  HISOTRY OF PRESENT ILLNESS:    Joseph Contreras is a 76 y.o. male who is status post endovascular aneurysm repair on 11/18/2011.  He had a CT scan in 2017 that showed a type II endoleak.  Maximum aortic diameter was 4.9 cm.  He is back today for follow-up.   PAST MEDICAL HISTORY:   Past Medical History:  Diagnosis Date  . AAA (abdominal aortic aneurysm) ([place]) 10/29/11   endovascular stent graft  . Cancer (HCC)    melanoma, squesmous cell hand, back  . Gout   . Gout   . Hypercholesterolemia   . Hypertension   . Kidney atrophy    left, known occluded LRA stent  . Normal coronary arteries 1996   low risk Myoview Jan 2013  . Renal artery stenosis [facility] April 1996   LRA stent- now known to be occluded     FAMILY HISTORY:   Family History  Problem Relation Age of Onset  . Heart disease Father   . Heart attack Father   . Diabetes Mother   . Hyperlipidemia Sister   . Hypertension Sister   . Hyperlipidemia Brother   . Hypertension Brother     SOCIAL HISTORY:   Social History   Tobacco Use  . Smoking status: Former Smoker    Packs/day: 1.00    Last attempt to quit: 10/05/1966    Years since quitting: 50.9  . Smokeless tobacco: Never Used  Substance Use Topics  . Alcohol use: No     ALLERGIES:   Allergies  Allergen Reactions  . Codeine Nausea And Vomiting     CURRENT MEDICATIONS:   Current Outpatient Medications  Medication Sig Dispense Refill  . allopurinol (ZYLOPRIM) 100 MG tablet Take 100 mg by mouth daily.    Marland Kitchen aspirin 325 MG tablet Take 325 mg by mouth daily.    . benazepril (LOTENSIN) 20 MG tablet Take 10 mg by mouth daily.     . bumetanide (BUMEX) 0.5 MG tablet Take 0.5 mg by mouth daily.    . Cholecalciferol (VITAMIN D3) 5000 units CAPS Take 5,000 Units by mouth daily.      . fish oil-omega-3 fatty acids 1000 MG capsule Take 3 g by mouth daily.    . indomethacin (INDOCIN) 50 MG capsule Take 50 mg by mouth as needed.    . Multiple Vitamin (MULTIVITAMIN) tablet Take 1 tablet by mouth daily.    . Potassium 99 MG TABS Take 595 mg by mouth 2 (two) times daily.     . simvastatin (ZOCOR) 20 MG tablet Take 20 mg by mouth daily.     No current facility-administered medications for this visit.     REVIEW OF SYSTEMS:   [X]  denotes positive finding, [ ]  denotes negative finding Cardiac  Comments:  Chest pain or chest pressure:    Shortness of breath upon exertion:    Short of breath when lying flat:    Irregular heart rhythm:        Vascular    Pain in calf, thigh, or hip brought on by ambulation:    Pain in feet at night that wakes you up from your sleep:     Blood clot in your veins:    Leg swelling:         Pulmonary    Oxygen at home:  Productive cough:     Wheezing:         Neurologic    Sudden weakness in arms or legs:     Sudden numbness in arms or legs:     Sudden onset of difficulty speaking or slurred speech:    Temporary loss of vision in one eye:     Problems with dizziness:         Gastrointestinal    Blood in stool:     Vomited blood:         Genitourinary    Burning when urinating:     Blood in urine:        Psychiatric    Major depression:         Hematologic    Bleeding problems:    Problems with blood clotting too easily:        Skin    Rashes or ulcers:        Constitutional    Fever or chills:      PHYSICAL EXAM:   Vitals:   09/05/17 0833  BP: 125/76  Pulse: (!) 55  Resp: 20  Temp: (!) 97.1 F (36.2 C)  TempSrc: Oral  SpO2: 96%  Weight: 200 lb (90.7 kg)  Height: 5\' 8"  (1.727 m)    GENERAL: The patient is a well-nourished male, in no acute distress. The vital signs are documented above. CARDIAC: There is a regular rate and rhythm.  VASCULAR: No carotid bruits PULMONARY: Non-labored  respirations ABDOMEN: Soft and non-tender .  MUSCULOSKELETAL: There are no major deformities or cyanosis. NEUROLOGIC: No focal weakness or paresthesias are detected. SKIN: There are no ulcers or rashes noted. PSYCHIATRIC: The patient has a normal affect.  STUDIES:   I have ordered and reviewed his vascular ultrasound.This shows a maximum aortic diameter of 5.0 cm.  There been no significant interval change.  MEDICAL ISSUES:   AAA no significant interval change in his: Abdominal aortic aneurysm.  I am going to have him come back in 1 year with a CT angiogram.  I will need to pay close attention to the diameter of his iliac artery as I feel this was underrepresented by his ultrasound today.    Annamarie Major, MD [facility] of [facility] [phone] Pager [phone]

## 2017-10-18 DIAGNOSIS — F4312 Post-traumatic stress disorder, chronic: Secondary | ICD-10-CM | POA: Diagnosis not present

## 2017-11-17 DIAGNOSIS — F4312 Post-traumatic stress disorder, chronic: Secondary | ICD-10-CM | POA: Diagnosis not present

## 2017-12-22 DIAGNOSIS — F4312 Post-traumatic stress disorder, chronic: Secondary | ICD-10-CM | POA: Diagnosis not present

## 2018-02-02 DIAGNOSIS — F4312 Post-traumatic stress disorder, chronic: Secondary | ICD-10-CM | POA: Diagnosis not present

## 2018-03-14 DIAGNOSIS — I10 Essential (primary) hypertension: Secondary | ICD-10-CM | POA: Diagnosis not present

## 2018-03-14 DIAGNOSIS — Z0000 Encounter for general adult medical examination without abnormal findings: Secondary | ICD-10-CM | POA: Diagnosis not present

## 2018-03-14 DIAGNOSIS — I714 Abdominal aortic aneurysm, without rupture: Secondary | ICD-10-CM | POA: Diagnosis not present

## 2018-03-14 DIAGNOSIS — E782 Mixed hyperlipidemia: Secondary | ICD-10-CM | POA: Diagnosis not present

## 2018-03-14 DIAGNOSIS — I701 Atherosclerosis of renal artery: Secondary | ICD-10-CM | POA: Diagnosis not present

## 2018-03-14 DIAGNOSIS — Z8739 Personal history of other diseases of the musculoskeletal system and connective tissue: Secondary | ICD-10-CM | POA: Diagnosis not present

## 2018-03-15 DIAGNOSIS — E782 Mixed hyperlipidemia: Secondary | ICD-10-CM | POA: Insufficient documentation

## 2018-03-15 DIAGNOSIS — Z8739 Personal history of other diseases of the musculoskeletal system and connective tissue: Secondary | ICD-10-CM | POA: Insufficient documentation

## 2018-03-29 DIAGNOSIS — F4312 Post-traumatic stress disorder, chronic: Secondary | ICD-10-CM | POA: Diagnosis not present

## 2018-06-01 DIAGNOSIS — F4312 Post-traumatic stress disorder, chronic: Secondary | ICD-10-CM | POA: Diagnosis not present

## 2018-07-05 DIAGNOSIS — F4312 Post-traumatic stress disorder, chronic: Secondary | ICD-10-CM | POA: Diagnosis not present

## 2018-08-24 DIAGNOSIS — F4312 Post-traumatic stress disorder, chronic: Secondary | ICD-10-CM | POA: Diagnosis not present

## 2018-11-03 ENCOUNTER — Encounter: Payer: Self-pay | Admitting: Cardiovascular Disease

## 2018-11-03 ENCOUNTER — Ambulatory Visit (INDEPENDENT_AMBULATORY_CARE_PROVIDER_SITE_OTHER): Payer: Medicare Other | Admitting: Cardiovascular Disease

## 2018-11-03 VITALS — BP 106/58 | HR 55 | Ht 68.0 in | Wt 206.0 lb

## 2018-11-03 DIAGNOSIS — E7800 Pure hypercholesterolemia, unspecified: Secondary | ICD-10-CM | POA: Diagnosis not present

## 2018-11-03 DIAGNOSIS — I10 Essential (primary) hypertension: Secondary | ICD-10-CM

## 2018-11-03 DIAGNOSIS — I714 Abdominal aortic aneurysm, without rupture, unspecified: Secondary | ICD-10-CM

## 2018-11-03 DIAGNOSIS — I701 Atherosclerosis of renal artery: Secondary | ICD-10-CM

## 2018-11-03 NOTE — Assessment & Plan Note (Signed)
History of hyperlipidemia with lipid profile performed by his PCP 06/22/2018 revealing a total cholesterol 139, LDL 53 and HDL 33.  Triglyceride level was 265 although he does admit to dietary indiscretion.

## 2018-11-03 NOTE — Assessment & Plan Note (Signed)
History of essential hypertension her blood pressure measured today at 106/58.  Is on benazepril.  Continue current meds at current dosing.

## 2018-11-03 NOTE — Assessment & Plan Note (Signed)
History of abdominal aortic aneurysm status post endoluminal stent grafting by Dr. Trula Slade in the past he follows by duplex ultrasound.

## 2018-11-03 NOTE — Patient Instructions (Signed)

## 2018-11-03 NOTE — Assessment & Plan Note (Signed)
History of remote renal artery stenting by myself back in April 1996 with subsequent Dopplers revealing it to be occluded.

## 2018-11-03 NOTE — Progress Notes (Signed)
11/03/2018 Joseph Contreras   1941/09/06  [id number]  Primary Physician Clinic, [facility] Primary Cardiologist: Lorretta Harp MD FACP, [place], [place], [place]  HPI:  Joseph Contreras is a 78 y.o.  mildly overweight married Caucasian male, father of 52 and grandfather of 5 grandchildren, whom I last saw  06/17/2017 He is accompanied by his wife today. I performed PTA and stenting of his left renal artery back in April 1996. His other problems include hypertension and hyperlipidemia. We have been following his renal Doppler studies which have since shown occlusion of his left renal artery approximately 7 years ago with a patent right renal artery. We have also been following his abdominal aorta, which had grown to 5.5 cm. I referred him to Dr. Annamarie Major, who performed abdominal stent grafting on him February of last year. He had a negative Myoview as part of that. His labwork is followed at the [facility] which was recently performed 06/22/2018 revealing total cholesterol 139, LDL 53 and HDL of 33.  His triglyceride level was 265.  I saw him a year ago he is remained remarkably stable without symptoms.  He does wear a fit bit and tracks his daily steps which range 25 and 7000/day.  Current Meds  Medication Sig  . allopurinol (ZYLOPRIM) 100 MG tablet Take 100 mg by mouth daily.  Marland Kitchen aspirin 325 MG tablet Take 325 mg by mouth daily.  . benazepril (LOTENSIN) 20 MG tablet Take 10 mg by mouth daily.   . bumetanide (BUMEX) 0.5 MG tablet Take 0.5 mg by mouth daily.  . Cholecalciferol (VITAMIN D3) 5000 units CAPS Take 5,000 Units by mouth daily.   . fish oil-omega-3 fatty acids 1000 MG capsule Take 3 g by mouth daily.  . indomethacin (INDOCIN) 50 MG capsule Take 50 mg by mouth as needed.  . Multiple Vitamin (MULTIVITAMIN) tablet Take 1 tablet by mouth daily.  . Potassium 99 MG TABS Take 595 mg by mouth 2 (two) times daily.   . simvastatin (ZOCOR) 20 MG tablet Take 20 mg by mouth daily.      Allergies  Allergen Reactions  . Codeine Nausea And Vomiting    Social History   Socioeconomic History  . Marital status: Married    Spouse name: Not on file  . Number of children: Not on file  . Years of education: Not on file  . Highest education level: Not on file  Occupational History  . Not on file  Social Needs  . Financial resource strain: Not on file  . Food insecurity:    Worry: Not on file    Inability: Not on file  . Transportation needs:    Medical: Not on file    Non-medical: Not on file  Tobacco Use  . Smoking status: Former Smoker    Packs/day: 1.00    Last attempt to quit: 10/05/1966    Years since quitting: 52.1  . Smokeless tobacco: Never Used  Substance and Sexual Activity  . Alcohol use: No  . Drug use: No  . Sexual activity: Not on file  Lifestyle  . Physical activity:    Days per week: Not on file    Minutes per session: Not on file  . Stress: Not on file  Relationships  . Social connections:    Talks on phone: Not on file    Gets together: Not on file    Attends religious service: Not on file    Active member of  club or organization: Not on file    Attends meetings of clubs or organizations: Not on file    Relationship status: Not on file  . Intimate partner violence:    Fear of current or ex partner: Not on file    Emotionally abused: Not on file    Physically abused: Not on file    Forced sexual activity: Not on file  Other Topics Concern  . Not on file  Social History Narrative  . Not on file     Review of Systems: General: negative for chills, fever, night sweats or weight changes.  Cardiovascular: negative for chest pain, dyspnea on exertion, edema, orthopnea, palpitations, paroxysmal nocturnal dyspnea or shortness of breath Dermatological: negative for rash Respiratory: negative for cough or wheezing Urologic: negative for hematuria Abdominal: negative for nausea, vomiting, diarrhea, bright red blood per rectum, melena, or  hematemesis Neurologic: negative for visual changes, syncope, or dizziness All other systems reviewed and are otherwise negative except as noted above.    Blood pressure (!) 106/58, pulse (!) 55, height 5\' 8"  (1.727 m), weight 206 lb (93.4 kg).  General appearance: alert and no distress Neck: no adenopathy, no carotid bruit, no JVD, supple, symmetrical, trachea midline and thyroid not enlarged, symmetric, no tenderness/mass/nodules Lungs: clear to auscultation bilaterally Heart: regular rate and rhythm, S1, S2 normal, no murmur, click, rub or gallop Extremities: extremities normal, atraumatic, no cyanosis or edema Pulses: 2+ and symmetric Skin: Skin color, texture, turgor normal. No rashes or lesions Neurologic: Alert and oriented X 3, normal strength and tone. Normal symmetric reflexes. Normal coordination and gait  EKG sinus bradycardia 55 without ST or T wave changes.  I personally reviewed this EKG.  ASSESSMENT AND PLAN:   Hypercholesterolemia History of hyperlipidemia with lipid profile performed by his PCP 06/22/2018 revealing a total cholesterol 139, LDL 53 and HDL 33.  Triglyceride level was 265 although he does admit to dietary indiscretion.  AAA (abdominal aortic aneurysm)- S/P Ao stent graft 2/13 History of abdominal aortic aneurysm status post endoluminal stent grafting by Dr. Trula Slade in the past he follows by duplex ultrasound.  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  Essential hypertension History of essential hypertension her blood pressure measured today at 106/58.  Is on benazepril.  Continue current meds at current dosing.      Lorretta Harp MD FACP,FACC,FAHA, [facility] 11/03/2018 11:06 AM

## 2018-11-16 ENCOUNTER — Other Ambulatory Visit: Payer: Self-pay

## 2018-11-16 DIAGNOSIS — I714 Abdominal aortic aneurysm, without rupture, unspecified: Secondary | ICD-10-CM

## 2018-12-11 ENCOUNTER — Other Ambulatory Visit: Payer: Medicare Other

## 2018-12-11 ENCOUNTER — Ambulatory Visit: Payer: Medicare Other | Admitting: Surgery

## 2019-03-01 ENCOUNTER — Ambulatory Visit
Admission: RE | Admit: 2019-03-01 | Discharge: 2019-03-01 | Disposition: A | Discharge status: Home / Self Care | Payer: Medicare Other | Source: Ambulatory Visit | Attending: Vascular Surgery | Admitting: Vascular Surgery

## 2019-03-01 DIAGNOSIS — I714 Abdominal aortic aneurysm, without rupture, unspecified: Secondary | ICD-10-CM

## 2019-03-01 MED ORDER — IOPAMIDOL (ISOVUE-370) INJECTION 76%
75.0000 mL | Freq: Once | INTRAVENOUS | Status: AC | PRN
  Administered 2019-03-01: 75 mL via INTRAVENOUS

## 2019-03-05 ENCOUNTER — Ambulatory Visit (INDEPENDENT_AMBULATORY_CARE_PROVIDER_SITE_OTHER): Payer: Medicare Other | Admitting: Surgery

## 2019-03-05 ENCOUNTER — Other Ambulatory Visit: Payer: Self-pay

## 2019-03-05 ENCOUNTER — Encounter: Payer: Self-pay | Admitting: Surgery

## 2019-03-05 DIAGNOSIS — I714 Abdominal aortic aneurysm, without rupture, unspecified: Secondary | ICD-10-CM

## 2019-03-05 DIAGNOSIS — I701 Atherosclerosis of renal artery: Secondary | ICD-10-CM

## 2019-03-05 NOTE — Progress Notes (Signed)
[facility] [place]  Patient name: Joseph Contreras MRN: [id number] DOB: 1941/05/27 Sex: male      Virtual Visit via Video Note   This visit type was conducted due to national recommendations for restrictions regarding the COVID-19 Pandemic (e.g. social distancing) in an effort to limit this patient's exposure and mitigate transmission in our community.  Due to his co-morbid illnesses, this patient is at least at moderate risk for complications without adequate follow up.  This format is felt to be most appropriate for this patient at this time.  All issues noted in this document were discussed and addressed.  A limited physical exam was performed with this format.  Please refer to the patient's chart for his consent to telehealth for VVS  Patient Location: Home Provider Location: Office    REASON FOR APPOINTMENT:    Follow up  HISTORY OF PRESENT ILLNESS:   Joseph Contreras is a 78 y.o. male who is status post endovascular aneurysm repair on 11/18/2011.  He had a CT scan in 2017 that showed a type II endoleak.  Maximum aortic diameter was 4.9 cm.  He is back today for follow-up.Marland Kitchen  He has no complaints today.  He denies abdominal pain   The patient does not have symptoms concerning for COVID-19 infection (fever, chills, cough, or new shortness of breath).   PAST MEDICAL HISTORY    Past Medical History:  Diagnosis Date  . AAA (abdominal aortic aneurysm) ([place]) 10/29/11   endovascular stent graft  . Cancer (HCC)    melanoma, squesmous cell hand, back  . Gout   . Gout   . Hypercholesterolemia   . Hypertension   . Kidney atrophy    left, known occluded LRA stent  . Normal coronary arteries 1996   low risk Myoview Jan 2013  . Renal artery stenosis [facility]) April 1996   LRA stent- now known to be occluded     FAMILY HISTORY   Family History  Problem Relation Age of Onset  . Heart disease Father   . Heart attack Father   . Diabetes Mother   .  Hyperlipidemia Sister   . Hypertension Sister   . Hyperlipidemia Brother   . Hypertension Brother     SOCIAL HISTORY:   Social History   Socioeconomic History  . Marital status: Married    Spouse name: Not on file  . Number of children: Not on file  . Years of education: Not on file  . Highest education level: Not on file  Occupational History  . Not on file  Social Needs  . Financial resource strain: Not on file  . Food insecurity    Worry: Not on file    Inability: Not on file  . Transportation needs    Medical: Not on file    Non-medical: Not on file  Tobacco Use  . Smoking status: Former Smoker    Packs/day: 1.00    Quit date: 10/05/1966    Years since quitting: 52.4  . Smokeless tobacco: Never Used  Substance and Sexual Activity  . Alcohol use: No  . Drug use: No  . Sexual activity: Not on file  Lifestyle  . Physical activity    Days per week: Not on file    Minutes per session: Not on file  . Stress: Not on file  Relationships  . Social connections    Talks on phone: Not on  file    Gets together: Not on file    Attends religious service: Not on file    Active member of club or organization: Not on file    Attends meetings of clubs or organizations: Not on file    Relationship status: Not on file  . Intimate partner violence    Fear of current or ex partner: Not on file    Emotionally abused: Not on file    Physically abused: Not on file    Forced sexual activity: Not on file  Other Topics Concern  . Not on file  Social History Narrative  . Not on file    ALLERGIES:    Allergies  Allergen Reactions  . Codeine Nausea And Vomiting    CURRENT MEDICATIONS:    Current Outpatient Medications  Medication Sig Dispense Refill  . allopurinol (ZYLOPRIM) 100 MG tablet Take 100 mg by mouth daily.    Marland Kitchen aspirin 325 MG tablet Take 325 mg by mouth daily.    . benazepril (LOTENSIN) 20 MG tablet Take 10 mg by mouth daily.     . bumetanide (BUMEX) 0.5 MG  tablet Take 0.5 mg by mouth daily.    . Cholecalciferol (VITAMIN D3) 5000 units CAPS Take 5,000 Units by mouth daily.     . fish oil-omega-3 fatty acids 1000 MG capsule Take 3 g by mouth daily.    . indomethacin (INDOCIN) 50 MG capsule Take 50 mg by mouth as needed.    . Multiple Vitamin (MULTIVITAMIN) tablet Take 1 tablet by mouth daily.    . Potassium 99 MG TABS Take 595 mg by mouth 2 (two) times daily.     . simvastatin (ZOCOR) 20 MG tablet Take 20 mg by mouth daily.     No current facility-administered medications for this visit.     REVIEW OF SYSTEMS:   Please see the history of present illness.     All other systems reviewed and are negative.  PHYSICAL EXAM:    GENERAL: The patient is a well-nourished male, in no acute distress PULMONARY: Nonlabored respirations SKIN: There are no ulcers or rashes noted. PSYCHIATRIC: The patient has a normal affect.   Recent Labs: No results found for requested labs within last 8760 hours.   Recent Lipid Panel No results found for: CHOL, TRIG, HDL, CHOLHDL, LDLCALC, LDLDIRECT  Wt Readings from Last 3 Encounters:  11/03/18 93.4 kg  09/05/17 90.7 kg  06/17/17 92.1 kg     STUDIES:   I have ordered and reviewed the following: 1. Stable 5.0 cm abdominal aortic aneurysm status post endovascular aortic repair. Persistent tiny, subtle and clinically insignificant type 2 endoleak. The excluded aneurysm sac demonstrates no interval change dating back to 08/23/2016. 2. 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 3. The common hepatic artery is replaced to the SMA. 4. Aortic aneurysm NOS (ICD10-I71.9). Aortic Atherosclerosis (ICD10-170.0)  NON-VASCULAR  1. Ancillary findings as above without significant interval change. ASSESSMENT and PLAN   AAA:  No change in AAA size with small type 2 leak.  I have recommended follow up in 1 year with abdominal u/s   COVID-19 Education: The signs and symptoms of COVID-19 were discussed with  the patient and how to seek care for testing (follow up with PCP or arrange E-visit).    Time:   Today, I have spent 14 minutes with the patient with telehealth technology discussing the above problems.    Disposition:  Follow up 1 year    Annamarie Major,  IV, MD, FACS [facility] [phone] Pager [phone]

## 2020-01-29 ENCOUNTER — Other Ambulatory Visit: Payer: Self-pay

## 2020-01-29 ENCOUNTER — Encounter: Payer: Self-pay | Admitting: Cardiovascular Disease

## 2020-01-29 ENCOUNTER — Ambulatory Visit (INDEPENDENT_AMBULATORY_CARE_PROVIDER_SITE_OTHER): Payer: Medicare Other | Admitting: Cardiovascular Disease

## 2020-01-29 VITALS — BP 128/76 | HR 66 | Temp 98.2°F | Ht 68.0 in | Wt 205.0 lb

## 2020-01-29 DIAGNOSIS — I714 Abdominal aortic aneurysm, without rupture, unspecified: Secondary | ICD-10-CM

## 2020-01-29 DIAGNOSIS — I10 Essential (primary) hypertension: Secondary | ICD-10-CM

## 2020-01-29 DIAGNOSIS — I701 Atherosclerosis of renal artery: Secondary | ICD-10-CM | POA: Diagnosis not present

## 2020-01-29 NOTE — Assessment & Plan Note (Signed)
History of hyperlipidemia on statin therapy with lipid profile performed by his PCP 01/14/2020 revealing a total cholesterol 143, LDL 52 and HDL of 35.  His triglyceride level was 280, lower than its been in the past.

## 2020-01-29 NOTE — Progress Notes (Signed)
01/29/2020 Joseph Contreras   18-Dec-1940  [id number]  Primary Physician Clinic, [facility] Primary Cardiologist: Lorretta Harp MD FACP, [place], [place], [place]  HPI:  Joseph Contreras is a 79 y.o.  mildly overweight married Caucasian male, father of 59 and grandfather of 5 grandchildren, whom I last saw  11/03/2018.. I performed PTA and stenting of his left renal artery back in April 1996. His other problems include hypertension and hyperlipidemia. We have been following his renal Doppler studies which have since shown occlusion of his left renal artery approximately 7 years ago with a patent right renal artery. We have also been following his abdominal aorta, which had grown to 5.5 cm. I referred him to Dr. Annamarie Major, who performed abdominal stent grafting on him February of last year. He had a negative Myoview as part of that. His labwork is followed at the [facility] was recently performed 06/22/2018 revealing total cholesterol 139, LDL 53 and HDL of 33.  His triglyceride level was 265.  I saw him a year ago he is remained remarkably stable without symptoms.  He does wear a fit bit and tracks his daily steps which are currently in the 6000 range.  Since I saw him a year ago he continues to do well.  Dr. Trula Slade follows his endoluminal stent graft.  He denies chest pain or shortness of breath.   Current Meds  Medication Sig  . allopurinol (ZYLOPRIM) 300 MG tablet Take 300 mg by mouth daily.   Marland Kitchen aspirin 325 MG tablet Take 325 mg by mouth daily.  . benazepril (LOTENSIN) 20 MG tablet Take 10 mg by mouth daily.   . bumetanide (BUMEX) 0.5 MG tablet Take 0.5 mg by mouth daily.  . Cholecalciferol (VITAMIN D3) 5000 units CAPS Take 5,000 Units by mouth daily.   . fish oil-omega-3 fatty acids 1000 MG capsule Take 3 g by mouth daily.  . indomethacin (INDOCIN) 50 MG capsule Take 50 mg by mouth as needed.  . metFORMIN (GLUCOPHAGE) 500 MG tablet Take 500 mg by mouth daily.  . Multiple  Vitamin (MULTIVITAMIN) tablet Take 1 tablet by mouth daily.  . Potassium 99 MG TABS Take 297 mg by mouth daily.   . simvastatin (ZOCOR) 20 MG tablet Take 20 mg by mouth daily.     Allergies  Allergen Reactions  . Codeine Nausea And Vomiting    Social History   Socioeconomic History  . Marital status: Married    Spouse name: Not on file  . Number of children: Not on file  . Years of education: Not on file  . Highest education level: Not on file  Occupational History  . Not on file  Tobacco Use  . Smoking status: Former Smoker    Packs/day: 1.00    Quit date: 10/05/1966    Years since quitting: 53.3  . Smokeless tobacco: Never Used  Substance and Sexual Activity  . Alcohol use: No  . Drug use: No  . Sexual activity: Not on file  Other Topics Concern  . Not on file  Social History Narrative  . Not on file   Social Determinants of Health   Financial Resource Strain:   . Difficulty of Paying Living Expenses:   Food Insecurity:   . Worried About Charity fundraiser in the Last Year:   . Arboriculturist in the Last Year:   Transportation Needs:   . Film/video editor (Medical):   Marland Kitchen Lack of  Transportation (Non-Medical):   Physical Activity:   . Days of Exercise per Week:   . Minutes of Exercise per Session:   Stress:   . Feeling of Stress :   Social Connections:   . Frequency of Communication with Friends and Family:   . Frequency of Social Gatherings with Friends and Family:   . Attends Religious Services:   . Active Member of Clubs or Organizations:   . Attends Archivist Meetings:   Marland Kitchen Marital Status:   Intimate Partner Violence:   . Fear of Current or Ex-Partner:   . Emotionally Abused:   Marland Kitchen Physically Abused:   . Sexually Abused:      Review of Systems: General: negative for chills, fever, night sweats or weight changes.  Cardiovascular: negative for chest pain, dyspnea on exertion, edema, orthopnea, palpitations, paroxysmal nocturnal dyspnea  or shortness of breath Dermatological: negative for rash Respiratory: negative for cough or wheezing Urologic: negative for hematuria Abdominal: negative for nausea, vomiting, diarrhea, bright red blood per rectum, melena, or hematemesis Neurologic: negative for visual changes, syncope, or dizziness All other systems reviewed and are otherwise negative except as noted above.    Blood pressure 128/76, pulse 66, temperature 98.2 F (36.8 C), height 5\' 8"  (1.727 m), weight 205 lb (93 kg), SpO2 97 %.  General appearance: alert and no distress Neck: no adenopathy, no carotid bruit, no JVD, supple, symmetrical, trachea midline and thyroid not enlarged, symmetric, no tenderness/mass/nodules Lungs: clear to auscultation bilaterally Heart: regular rate and rhythm, S1, S2 normal, no murmur, click, rub or gallop Extremities: extremities normal, atraumatic, no cyanosis or edema Pulses: 2+ and symmetric Skin: Skin color, texture, turgor normal. No rashes or lesions Neurologic: Alert and oriented X 3, normal strength and tone. Normal symmetric reflexes. Normal coordination and gait  EKG sinus rhythm at 66 without ST or T wave changes.  I personally reviewed this EKG.  ASSESSMENT AND PLAN:   Hypercholesterolemia History of hyperlipidemia on statin therapy with lipid profile performed by his PCP 01/14/2020 revealing a total cholesterol 143, LDL 52 and HDL of 35.  His triglyceride level was 280, lower than its been in the past.  AAA (abdominal aortic aneurysm)- S/P Ao stent graft 2/13 History of abdominal aortic aneurysm stent status post endoluminal stent grafting by Dr. Trula Slade will be followed by duplex ultrasound.  Renal artery stenosis- remote LRA stent, known to be occluded now History of left renal artery stent which I performed April 1996.  This has subsequently been shown to have occluded.  His last renal Doppler study was in 2016.  Essential hypertension History of essential hypertension  blood pressure measured today 128/76.  He is on benazepril.      Lorretta Harp MD FACP,FACC,FAHA, [facility] 01/29/2020 4:29 PM

## 2020-01-29 NOTE — Assessment & Plan Note (Signed)
History of left renal artery stent which I performed April 1996.  This has subsequently been shown to have occluded.  His last renal Doppler study was in 2016.

## 2020-01-29 NOTE — Assessment & Plan Note (Signed)
History of abdominal aortic aneurysm stent status post endoluminal stent grafting by Dr. Trula Slade will be followed by duplex ultrasound.

## 2020-01-29 NOTE — Assessment & Plan Note (Signed)
History of essential hypertension blood pressure measured today 128/76.  He is on benazepril.

## 2020-01-29 NOTE — Patient Instructions (Signed)

## 2020-03-03 DIAGNOSIS — Z0000 Encounter for general adult medical examination without abnormal findings: Secondary | ICD-10-CM | POA: Diagnosis not present

## 2020-03-03 DIAGNOSIS — I10 Essential (primary) hypertension: Secondary | ICD-10-CM | POA: Diagnosis not present

## 2020-03-03 DIAGNOSIS — E782 Mixed hyperlipidemia: Secondary | ICD-10-CM | POA: Diagnosis not present

## 2020-05-20 ENCOUNTER — Other Ambulatory Visit: Payer: Self-pay | Admitting: *Deleted

## 2020-05-20 DIAGNOSIS — I714 Abdominal aortic aneurysm, without rupture, unspecified: Secondary | ICD-10-CM

## 2020-05-29 ENCOUNTER — Other Ambulatory Visit: Payer: Self-pay

## 2020-05-29 ENCOUNTER — Ambulatory Visit (HOSPITAL_COMMUNITY)
Admission: RE | Admit: 2020-05-29 | Discharge: 2020-05-29 | Disposition: A | Discharge status: Home / Self Care | Payer: Medicare Other | Source: Ambulatory Visit | Attending: Vascular Surgery | Admitting: Vascular Surgery

## 2020-05-29 ENCOUNTER — Ambulatory Visit (INDEPENDENT_AMBULATORY_CARE_PROVIDER_SITE_OTHER): Payer: Medicare Other | Admitting: Physician Assistant

## 2020-05-29 VITALS — BP 129/75 | HR 58 | Temp 98.3°F | Resp 20 | Ht 68.0 in | Wt 205.9 lb

## 2020-05-29 DIAGNOSIS — I701 Atherosclerosis of renal artery: Secondary | ICD-10-CM

## 2020-05-29 DIAGNOSIS — I714 Abdominal aortic aneurysm, without rupture, unspecified: Secondary | ICD-10-CM

## 2020-05-29 NOTE — Progress Notes (Signed)
Established EVAR   History of Present Illness   Joseph Contreras is a 79 y.o. (08-13-1941) male who presents for routine follow up s/p EVAR by Dr. Trula Slade (Date: 10/2011).  Several years ago there was a type II endoleak noted on duplex.  There was no intervention necessary and patient has been followed with ongoing surveillance.  He denies any new or changing abdominal or back pain.  He also denies any claudication, rest pain, or nonhealing wounds of bilateral lower extremities.  He is on aspirin and statin daily.  Patient states he recently was diagnosed with type 2 diabetes and has been placed on Metformin [facility] [place].  His most recent hemoglobin A1c was 6.4.  The patient's PMH, PSH, SH, and FamHx were reviewed and are unchanged from prior visit.  Current Outpatient Medications  Medication Sig Dispense Refill  . allopurinol (ZYLOPRIM) 300 MG tablet Take 300 mg by mouth daily.     Marland Kitchen aspirin 325 MG tablet Take 325 mg by mouth daily.    . benazepril (LOTENSIN) 20 MG tablet Take 10 mg by mouth daily.     . bumetanide (BUMEX) 0.5 MG tablet Take 0.5 mg by mouth daily.    . Cholecalciferol (VITAMIN D3) 5000 units CAPS Take 5,000 Units by mouth daily.     . fish oil-omega-3 fatty acids 1000 MG capsule Take 3 g by mouth daily.    . indomethacin (INDOCIN) 50 MG capsule Take 50 mg by mouth as needed.    . metFORMIN (GLUCOPHAGE) 500 MG tablet Take 500 mg by mouth daily.    . Multiple Vitamin (MULTIVITAMIN) tablet Take 1 tablet by mouth daily.    . Potassium 99 MG TABS Take 297 mg by mouth daily.     . simvastatin (ZOCOR) 20 MG tablet Take 20 mg by mouth daily.     No current facility-administered medications for this visit.    REVIEW OF SYSTEMS (negative unless checked):   Cardiac:  []  Chest pain or chest pressure? []  Shortness of breath upon activity? []  Shortness of breath when lying flat? []  Irregular heart rhythm?  Vascular:  []  Pain in calf, thigh, or hip  brought on by walking? []  Pain in feet at night that wakes you up from your sleep? []  Blood clot in your veins? []  Leg swelling?  Pulmonary:  []  Oxygen at home? []  Productive cough? []  Wheezing?  Neurologic:  []  Sudden weakness in arms or legs? []  Sudden numbness in arms or legs? []  Sudden onset of difficult speaking or slurred speech? []  Temporary loss of vision in one eye? []  Problems with dizziness?  Gastrointestinal:  []  Blood in stool? []  Vomited blood?  Genitourinary:  []  Burning when urinating? []  Blood in urine?  Psychiatric:  []  Major depression  Hematologic:  []  Bleeding problems? []  Problems with blood clotting?  Dermatologic:  []  Rashes or ulcers?  Constitutional:  []  Fever or chills?  Ear/Nose/Throat:  []  Change in hearing? []  Nose bleeds? []  Sore throat?  Musculoskeletal:  []  Back pain? []  Joint pain? []  Muscle pain?   Physical Examination   Vitals:   05/29/20 0828  BP: 129/75  Pulse: (!) 58  Resp: 20  Temp: 98.3 F (36.8 C)  TempSrc: Temporal  SpO2: 95%  Weight: 205 lb 14.4 oz (93.4 kg)  Height: 5\' 8"  (1.727 m)   Body mass index is 31.31 kg/m.  General:  WDWN in NAD; vital signs documented above Gait: Not observed HENT:  WNL, normocephalic Pulmonary: normal non-labored breathing , without Rales, rhonchi,  wheezing Cardiac: regular HR Abdomen: soft, NT, no masses Skin: without rashes Vascular Exam/Pulses:  Right Left  Radial 2+ (normal) 2+ (normal)  DP 1+ (weak) 2+ (normal)  PT absent absent   Extremities: without ischemic changes, without Gangrene , without cellulitis; without open wounds;  Musculoskeletal: no muscle wasting or atrophy  Neurologic: A&O X 3;  No focal weakness or paresthesias are detected Psychiatric:  The pt has Normal affect.  Non-Invasive Vascular Imaging   EVAR Duplex   AAA sac size: 4.95 cm  no endoleak detected   Medical Decision Making   Joseph Contreras is a 79 y.o. male who presents s/p  EVAR.     No change in AAA sac size compared to last year's duplex.  Max AAA diameter 4.95cm  No endoleak noted on today's duplex  Recheck in 1 year  Continue aspirin and statin daily  Follow regularly with PCP for management of chronic medical conditions including type II diabetes mellitus   Dagoberto Ligas PA-C [facility] [place] Office: [phone]  Clinic MD: Scot Dock

## 2020-06-16 DIAGNOSIS — Z23 Encounter for immunization: Secondary | ICD-10-CM | POA: Diagnosis not present

## 2021-01-23 ENCOUNTER — Ambulatory Visit (INDEPENDENT_AMBULATORY_CARE_PROVIDER_SITE_OTHER): Payer: Medicare Other | Admitting: Cardiovascular Disease

## 2021-01-23 ENCOUNTER — Encounter: Payer: Self-pay | Admitting: Cardiovascular Disease

## 2021-01-23 ENCOUNTER — Other Ambulatory Visit: Payer: Self-pay

## 2021-01-23 DIAGNOSIS — E7800 Pure hypercholesterolemia, unspecified: Secondary | ICD-10-CM | POA: Diagnosis not present

## 2021-01-23 DIAGNOSIS — I714 Abdominal aortic aneurysm, without rupture, unspecified: Secondary | ICD-10-CM

## 2021-01-23 DIAGNOSIS — I10 Essential (primary) hypertension: Secondary | ICD-10-CM

## 2021-01-23 DIAGNOSIS — I701 Atherosclerosis of renal artery: Secondary | ICD-10-CM | POA: Diagnosis not present

## 2021-01-23 NOTE — Assessment & Plan Note (Signed)
History of abdominal aortic aneurysm status post endoluminal stent grafting by Dr. Trula Slade February 2013 which he follows by duplex ultrasound.

## 2021-01-23 NOTE — Assessment & Plan Note (Signed)
History of hyperlipidemia on statin therapy [facility] [place]. 

## 2021-01-23 NOTE — Assessment & Plan Note (Signed)
History of left renal artery stenting by myself April 1996 which has subsequently shown to be occluded with a widely patent right renal artery.

## 2021-01-23 NOTE — Assessment & Plan Note (Signed)
History of essential hypertension blood pressure measured today at 112/60.  He is on benazepril.

## 2021-01-23 NOTE — Progress Notes (Signed)
01/23/2021 Joseph Contreras   [birth year]  [id number]  Primary Physician Clinic, [facility] Primary Cardiologist: Lorretta Harp MD FACP, [employer], [place], [place]  HPI:  Joseph Contreras is a 80 y.o.  mildly overweight married Caucasian male, father of 87 and grandfather of 5 grandchildren, whom I last saw5/07/2020.. I performed PTA and stenting of his left renal artery back in April 1996. His other problems include hypertension and hyperlipidemia. We have been following his renal Doppler studies which have since shown occlusion of his left renal artery approximately 7 years ago with a patent right renal artery. We have also been following his abdominal aorta, which had grown to 5.5 cm. I referred him to Dr. Annamarie Major, who performed abdominal stent grafting on him February of 2013.  He had a negative Myoview as part of that.  Preprocedure work-up which was negative.  Since I saw him a year ago he continues to do well.  Dr. Trula Slade follows his endoluminal stent graft.  He denies chest pain or shortness of breath.   Current Meds  Medication Sig  . allopurinol (ZYLOPRIM) 300 MG tablet Take 300 mg by mouth daily.   Marland Kitchen aspirin 325 MG tablet Take 325 mg by mouth daily.  . benazepril (LOTENSIN) 20 MG tablet Take 10 mg by mouth daily.  . bumetanide (BUMEX) 0.5 MG tablet Take 0.5 mg by mouth daily.  . Cholecalciferol (VITAMIN D3) 5000 units CAPS Take 5,000 Units by mouth daily.   . fish oil-omega-3 fatty acids 1000 MG capsule Take 3 g by mouth daily.  . indomethacin (INDOCIN) 50 MG capsule Take 50 mg by mouth as needed.  . metFORMIN (GLUCOPHAGE) 500 MG tablet Take 500 mg by mouth daily.  . Multiple Vitamin (MULTIVITAMIN) tablet Take 1 tablet by mouth daily.  . Potassium 99 MG TABS Take 297 mg by mouth daily.   . simvastatin (ZOCOR) 20 MG tablet Take 20 mg by mouth daily.     Allergies  Allergen Reactions  . Codeine Nausea And Vomiting    Social History   Socioeconomic History  . Marital  status: Married    Spouse name: Not on file  . Number of children: Not on file  . Years of education: Not on file  . Highest education level: Not on file  Occupational History  . Not on file  Tobacco Use  . Smoking status: Former Smoker    Packs/day: 1.00    Quit date: 10/05/1966    Years since quitting: 54.3  . Smokeless tobacco: Never Used  Vaping Use  . Vaping Use: Never used  Substance and Sexual Activity  . Alcohol use: No  . Drug use: No  . Sexual activity: Not on file  Other Topics Concern  . Not on file  Social History Narrative  . Not on file   Social Determinants of Health   Financial Resource Strain: Not on file  Food Insecurity: Not on file  Transportation Needs: Not on file  Physical Activity: Not on file  Stress: Not on file  Social Connections: Not on file  Intimate Partner Violence: Not on file     Review of Systems: General: negative for chills, fever, night sweats or weight changes.  Cardiovascular: negative for chest pain, dyspnea on exertion, edema, orthopnea, palpitations, paroxysmal nocturnal dyspnea or shortness of breath Dermatological: negative for rash Respiratory: negative for cough or wheezing Urologic: negative for hematuria Abdominal: negative for nausea, vomiting, diarrhea, bright red blood per rectum, melena, or  hematemesis Neurologic: negative for visual changes, syncope, or dizziness All other systems reviewed and are otherwise negative except as noted above.    Blood pressure 112/60, pulse 60, height 5\' 9"  (1.753 m), weight 211 lb 6.4 oz (95.9 kg), SpO2 97 %.  General appearance: alert and no distress Neck: no adenopathy, no carotid bruit, no JVD, supple, symmetrical, trachea midline and thyroid not enlarged, symmetric, no tenderness/mass/nodules Lungs: clear to auscultation bilaterally Heart: regular rate and rhythm, S1, S2 normal, no murmur, click, rub or gallop Extremities: extremities normal, atraumatic, no cyanosis or  edema Pulses: 2+ and symmetric Skin: Skin color, texture, turgor normal. No rashes or lesions Neurologic: Alert and oriented X 3, normal strength and tone. Normal symmetric reflexes. Normal coordination and gait  EKG sinus rhythm at 60 without ST or T wave changes.  Personally reviewed this EKG.  ASSESSMENT AND PLAN:   Hypercholesterolemia History of hyperlipidemia on statin therapy followed by the [facility] in [place]  AAA (abdominal aortic aneurysm)- S/P Ao stent graft 2/13 History of abdominal aortic aneurysm status post endoluminal stent grafting by Dr. Trula Slade February 2013 which he follows by duplex ultrasound.  Renal artery stenosis- remote LRA stent, known to be occluded now History of left renal artery stenting by myself April 1996 which has subsequently shown to be occluded with a widely patent right renal artery.  Essential hypertension History of essential hypertension blood pressure measured today at 112/60.  He is on benazepril.      Lorretta Harp MD [place], [facility] 01/23/2021 3:34 PM

## 2021-01-23 NOTE — Patient Instructions (Signed)

## 2021-02-18 ENCOUNTER — Other Ambulatory Visit: Payer: Self-pay

## 2021-02-18 DIAGNOSIS — I714 Abdominal aortic aneurysm, without rupture, unspecified: Secondary | ICD-10-CM

## 2021-05-28 ENCOUNTER — Other Ambulatory Visit: Payer: Self-pay

## 2021-05-28 DIAGNOSIS — I714 Abdominal aortic aneurysm, without rupture, unspecified: Secondary | ICD-10-CM

## 2021-06-02 ENCOUNTER — Ambulatory Visit (INDEPENDENT_AMBULATORY_CARE_PROVIDER_SITE_OTHER): Payer: Medicare Other | Admitting: Physician Assistant

## 2021-06-02 ENCOUNTER — Other Ambulatory Visit: Payer: Self-pay

## 2021-06-02 ENCOUNTER — Ambulatory Visit (HOSPITAL_COMMUNITY)
Admission: RE | Admit: 2021-06-02 | Discharge: 2021-06-02 | Disposition: A | Discharge status: Home / Self Care | Payer: Medicare Other | Source: Ambulatory Visit | Attending: Vascular Surgery | Admitting: Vascular Surgery

## 2021-06-02 VITALS — BP 132/79 | HR 118 | Temp 97.6°F | Resp 20 | Ht 69.0 in | Wt 210.3 lb

## 2021-06-02 DIAGNOSIS — K115 Sialolithiasis: Secondary | ICD-10-CM | POA: Insufficient documentation

## 2021-06-02 DIAGNOSIS — K130 Diseases of lips: Secondary | ICD-10-CM | POA: Insufficient documentation

## 2021-06-02 DIAGNOSIS — I714 Abdominal aortic aneurysm, without rupture, unspecified: Secondary | ICD-10-CM

## 2021-06-02 DIAGNOSIS — R799 Abnormal finding of blood chemistry, unspecified: Secondary | ICD-10-CM | POA: Insufficient documentation

## 2021-06-02 DIAGNOSIS — I701 Atherosclerosis of renal artery: Secondary | ICD-10-CM | POA: Diagnosis not present

## 2021-06-02 DIAGNOSIS — H02429 Myogenic ptosis of unspecified eyelid: Secondary | ICD-10-CM | POA: Insufficient documentation

## 2021-06-02 DIAGNOSIS — H9319 Tinnitus, unspecified ear: Secondary | ICD-10-CM | POA: Insufficient documentation

## 2021-06-02 DIAGNOSIS — K1370 Unspecified lesions of oral mucosa: Secondary | ICD-10-CM | POA: Insufficient documentation

## 2021-06-02 DIAGNOSIS — H903 Sensorineural hearing loss, bilateral: Secondary | ICD-10-CM | POA: Insufficient documentation

## 2021-06-02 DIAGNOSIS — L989 Disorder of the skin and subcutaneous tissue, unspecified: Secondary | ICD-10-CM | POA: Insufficient documentation

## 2021-06-02 DIAGNOSIS — E663 Overweight: Secondary | ICD-10-CM | POA: Insufficient documentation

## 2021-06-02 DIAGNOSIS — H6123 Impacted cerumen, bilateral: Secondary | ICD-10-CM | POA: Insufficient documentation

## 2021-06-02 DIAGNOSIS — Z135 Encounter for screening for eye and ear disorders: Secondary | ICD-10-CM | POA: Insufficient documentation

## 2021-06-02 DIAGNOSIS — H33312 Horseshoe tear of retina without detachment, left eye: Secondary | ICD-10-CM | POA: Insufficient documentation

## 2021-06-02 DIAGNOSIS — L408 Other psoriasis: Secondary | ICD-10-CM | POA: Insufficient documentation

## 2021-06-02 DIAGNOSIS — G4733 Obstructive sleep apnea (adult) (pediatric): Secondary | ICD-10-CM | POA: Insufficient documentation

## 2021-06-02 DIAGNOSIS — L570 Actinic keratosis: Secondary | ICD-10-CM | POA: Insufficient documentation

## 2021-06-02 DIAGNOSIS — F528 Other sexual dysfunction not due to a substance or known physiological condition: Secondary | ICD-10-CM | POA: Insufficient documentation

## 2021-06-02 DIAGNOSIS — E119 Type 2 diabetes mellitus without complications: Secondary | ICD-10-CM | POA: Insufficient documentation

## 2021-06-02 DIAGNOSIS — C4401 Basal cell carcinoma of skin of lip: Secondary | ICD-10-CM | POA: Insufficient documentation

## 2021-06-02 DIAGNOSIS — E1140 Type 2 diabetes mellitus with diabetic neuropathy, unspecified: Secondary | ICD-10-CM | POA: Insufficient documentation

## 2021-06-02 DIAGNOSIS — K036 Deposits [accretions] on teeth: Secondary | ICD-10-CM | POA: Insufficient documentation

## 2021-06-02 DIAGNOSIS — K055 Other periodontal diseases: Secondary | ICD-10-CM | POA: Insufficient documentation

## 2021-06-02 DIAGNOSIS — K0262 Dental caries on smooth surface penetrating into dentin: Secondary | ICD-10-CM | POA: Insufficient documentation

## 2021-06-02 DIAGNOSIS — K5730 Diverticulosis of large intestine without perforation or abscess without bleeding: Secondary | ICD-10-CM | POA: Insufficient documentation

## 2021-06-02 DIAGNOSIS — Z461 Encounter for fitting and adjustment of hearing aid: Secondary | ICD-10-CM | POA: Insufficient documentation

## 2021-06-02 DIAGNOSIS — D485 Neoplasm of uncertain behavior of skin: Secondary | ICD-10-CM | POA: Insufficient documentation

## 2021-06-02 DIAGNOSIS — R635 Abnormal weight gain: Secondary | ICD-10-CM | POA: Insufficient documentation

## 2021-06-02 DIAGNOSIS — H02423 Myogenic ptosis of bilateral eyelids: Secondary | ICD-10-CM | POA: Insufficient documentation

## 2021-06-02 DIAGNOSIS — Z961 Presence of intraocular lens: Secondary | ICD-10-CM | POA: Insufficient documentation

## 2021-06-02 DIAGNOSIS — M4000 Postural kyphosis, site unspecified: Secondary | ICD-10-CM | POA: Insufficient documentation

## 2021-06-02 NOTE — Progress Notes (Signed)
[facility] [place] HISTORY AND PHYSICAL   History of Present Illness:  Patient is a 80 y.o. year old male who presents for evaluation of abdominal aortic aneurysm.   s/p EVAR by Dr. Trula Slade (Date: 10/2011).  Several years ago there was a type II endoleak noted on duplex.  There was no intervention necessary and patient has been followed with ongoing surveillance.     Past Medical History:  Diagnosis Date   AAA (abdominal aortic aneurysm) ([place]) 10/29/11   endovascular stent graft   Cancer ([place])    melanoma, squesmous cell hand, back   Gout    Gout    Hypercholesterolemia    Hypertension    Kidney atrophy    left, known occluded LRA stent   Normal coronary arteries 1996   low risk Myoview Jan 2013   Renal artery stenosis [facility]) April 1996   LRA stent- now known to be occluded    Past Surgical History:  Procedure Laterality Date   ABDOMINAL AORTIC ENDOVASCULAR STENT GRAFT  Feb 2013   CARDIAC CATHETERIZATION  1995   normal cornaries   EYE SURGERY  2.5.13   retinal detachment     EYE SURGERY Right December 31, 2014   Cataract   EYE SURGERY Left Jan 19, 2015   Cataract   GANGLION CYST EXCISION  79   rt wrist   LEFT RENAL STENT  1996   now occluded   [place]  09/1988     Social History Social History   Tobacco Use   Smoking status: Former    Packs/day: 1.00    Types: Cigarettes    Quit date: 10/05/1966    Years since quitting: 50.6   Smokeless tobacco: Never  Vaping Use   Vaping Use: Never used  Substance Use Topics   Alcohol use: No   Drug use: No    Family History Family History  Problem Relation Age of Onset   Heart disease Father    Heart attack Father    Diabetes Mother    Hyperlipidemia Sister    Hypertension Sister    Hyperlipidemia Brother    Hypertension Brother     Allergies  Allergies  Allergen Reactions   Codeine Nausea And Vomiting     Current Outpatient Medications  Medication Sig Dispense  Refill   allopurinol (ZYLOPRIM) 300 MG tablet Take 300 mg by mouth daily.      aspirin 325 MG tablet Take 325 mg by mouth daily.     benazepril (LOTENSIN) 20 MG tablet Take 10 mg by mouth daily.     bumetanide (BUMEX) 0.5 MG tablet Take 0.5 mg by mouth daily.     Cholecalciferol (VITAMIN D3) 5000 units CAPS Take 5,000 Units by mouth daily.      fish oil-omega-3 fatty acids 1000 MG capsule Take 3 g by mouth daily.     indomethacin (INDOCIN) 50 MG capsule Take 50 mg by mouth as needed.     metFORMIN (GLUCOPHAGE) 500 MG tablet Take 500 mg by mouth daily.     Multiple Vitamin (MULTIVITAMIN) tablet Take 1 tablet by mouth daily.     Potassium 99 MG TABS Take 297 mg by mouth daily.      simvastatin (ZOCOR) 20 MG tablet Take 20 mg by mouth daily.     No current facility-administered medications for this visit.    ROS:   General:  No weight loss, Fever, chills  HEENT: No recent headaches, no  nasal bleeding, no visual changes, no sore throat  Neurologic: No dizziness, blackouts, seizures. No recent symptoms of stroke or mini- stroke. No recent episodes of slurred speech, or temporary blindness.  Cardiac: No recent episodes of chest pain/pressure, no shortness of breath at rest.  No shortness of breath with exertion.  Denies history of atrial fibrillation or irregular heartbeat  Vascular: No history of rest pain in feet.  No history of claudication.  No history of non-healing ulcer, No history of DVT   Pulmonary: No home oxygen, no productive cough, no hemoptysis,  No asthma or wheezing  Musculoskeletal:  '[ ]'$  Arthritis, '[ ]'$  Low back pain,  '[ ]'$  Joint pain  Hematologic:No history of hypercoagulable state.  No history of easy bleeding.  No history of anemia  Gastrointestinal: No hematochezia or melena,  No gastroesophageal reflux, no trouble swallowing  Urinary: '[ ]'$  chronic Kidney disease, '[ ]'$  on HD - '[ ]'$  MWF or '[ ]'$  TTHS, '[ ]'$  Burning with urination, '[ ]'$  Frequent urination, '[ ]'$  Difficulty  urinating;   Skin: No rashes  Psychological: No history of anxiety,  No history of depression   Physical Examination  There were no vitals filed for this visit.  There is no height or weight on file to calculate BMI.  General:  Alert and oriented, no acute distress HEENT: Normal Neck: No bruit or JVD Pulmonary: Clear to auscultation bilaterally Cardiac: Regular Rate and Rhythm without murmur Gastrointestinal: Soft, non-tender, non-distended, no mass, no scars Skin: No rash Extremity Pulses:  2+ radial, brachial, femoral, dorsalis pedis, posterior tibial pulses bilaterally Musculoskeletal: No deformity or edema  Neurologic: Upper and lower extremity motor 5/5 and symmetric  DATA:     Endovascular Aortic Repair (EVAR):  +----------+----------------+-------------------+-------------------+            Diameter AP (cm)Diameter Trans (cm)Velocities (cm/sec)  +----------+----------------+-------------------+-------------------+  Aorta     4.90            5.16               102                  +----------+----------------+-------------------+-------------------+  Right Limb1.76            1.78               64                   +----------+----------------+-------------------+-------------------+  Left Limb 1.55            1.57               62                   +----------+----------------+-------------------+-------------------+     Summary:  Abdominal Aorta: The largest aortic diameter remains essentially unchanged  compared to prior exam. Previous diameter measurement was 4.95 cm obtained  on 05/29/2020.  Patent endovascular aneurysm repair with no evidence of endoleak.      ASSESSMENT/PLAN: S/P EVAR 2013 by Dr. Trula Slade with history of endo leak The AAA is measuring 5.16 today on duplex without endo leak.  The iliac arteries are stable and  unchanged. He denise lumbar or abdominal pain.  He has palpable pedal pulses B.    I reviewed the study findings  with Dr. Carlis Abbott and he states there is no significant change in the size of the AAA sac since 2020.    He will f/u in 1 year for repeat  study.  If he develops sudden lumbar or abdominal pain he will call us other wise activity as tolerates.       Roxy Horseman PA-C [facility] [place] Office: [phone]  MD in clinic [place]

## 2021-06-15 DIAGNOSIS — Z23 Encounter for immunization: Secondary | ICD-10-CM | POA: Diagnosis not present

## 2021-11-27 ENCOUNTER — Telehealth: Payer: Self-pay | Admitting: *Deleted

## 2021-11-27 NOTE — Telephone Encounter (Signed)
Dr. Gwenlyn Found ?We have received a request to hold ASA in this patient with hx of remote renal artery stenting (1996). Of note, he also had EVAR with [facility] 2013.  ? ?Per cardiology, OK to hold ASA? ?

## 2021-11-27 NOTE — Telephone Encounter (Signed)
? ?  Pre-operative Risk Assessment  ?  ?Patient Name: Joseph Contreras  ?DOB: [birth year] ?MRN: [id number]  ? ?  ? ?Request for Surgical Clearance   ?EXCISIONAL BIOPSY RIGHT BUCCAL MUCOSAL ULCER ?Procedure:   ? ?Date of Surgery:  Clearance TBD                              ?   ?Surgeon:  DR Judson Roch JACOB ?Surgeon's Group or Practice Name:  [facility]  ?Phone number:  [phone] ?Fax number:  [phone] ?  ?Type of Clearance Requested:   ?- Pharmacy:  Hold Aspirin 7 DAYS ?  ?Type of Anesthesia:  None  ?{ ? ?Signed, ?Devra Dopp   ?11/27/2021, 12:13 PM  ? ?

## 2021-11-30 NOTE — Telephone Encounter (Signed)
? ? ?  Name: Joseph Contreras  ?DOB: [birth year]  ?MRN: [id number] ? ?Primary Cardiologist: Quay Burow, MD ? ? ?Preoperative team, please contact this patient and set up a phone call appointment for further preoperative risk assessment. Please obtain consent and complete medication review. Thank you for your help. ? ?Per Dr. Gwenlyn Found: Yes, OK to hold ASA. ? ? ?Ledora Bottcher, PA-C ?11/30/2021, 1:56 PM ?[phone] ?[facility] ?[address] ?[place], [place] [address] ? ? ?

## 2021-12-01 ENCOUNTER — Telehealth: Payer: Self-pay | Admitting: *Deleted

## 2021-12-01 NOTE — Telephone Encounter (Signed)
?  Patient Consent for Virtual Visit  ? ? ?   ? ?Joseph Contreras has provided verbal consent on 12/01/2021 for a virtual visit (video or telephone). ? ? ?CONSENT FOR VIRTUAL VISIT FOR:  Joseph Contreras  ?By participating in this virtual visit I agree to the following: ? ?I hereby voluntarily request, consent and authorize [facility] and its employed or contracted physicians, physician assistants, nurse practitioners or other licensed health care professionals (the Practitioner), to provide me with telemedicine health care services (the ?Services") as deemed necessary by the treating Practitioner. I acknowledge and consent to receive the Services by the Practitioner via telemedicine. I understand that the telemedicine visit will involve communicating with the Practitioner through live audiovisual communication technology and the disclosure of certain medical information by electronic transmission. I acknowledge that I have been given the opportunity to request an in-person assessment or other available alternative prior to the telemedicine visit and am voluntarily participating in the telemedicine visit. ? ?I understand that I have the right to withhold or withdraw my consent to the use of telemedicine in the course of my care at any time, without affecting my right to future care or treatment, and that the Practitioner or I may terminate the telemedicine visit at any time. I understand that I have the right to inspect all information obtained and/or recorded in the course of the telemedicine visit and may receive copies of available information for a reasonable fee.  I understand that some of the potential risks of receiving the Services via telemedicine include:  ?Delay or interruption in medical evaluation due to technological equipment failure or disruption; ?Information transmitted may not be sufficient (e.g. poor resolution of images) to allow for appropriate medical decision making by the Practitioner; and/or   ?In rare instances, security protocols could fail, causing a breach of personal health information. ? ?Furthermore, I acknowledge that it is my responsibility to provide information about my medical history, conditions and care that is complete and accurate to the best of my ability. I acknowledge that Practitioner's advice, recommendations, and/or decision may be based on factors not within their control, such as incomplete or inaccurate data provided by me or distortions of diagnostic images or specimens that may result from electronic transmissions. I understand that the practice of medicine is not an exact science and that Practitioner makes no warranties or guarantees regarding treatment outcomes. I acknowledge that a copy of this consent can be made available to me via my patient portal ([facility]), or I can request a printed copy by calling the office of [facility].   ? ?I understand that my insurance will be billed for this visit.  ? ?I have read or had this consent read to me. ?I understand the contents of this consent, which adequately explains the benefits and risks of the Services being provided via telemedicine.  ?I have been provided ample opportunity to ask questions regarding this consent and the Services and have had my questions answered to my satisfaction. ?I give my informed consent for the services to be provided through the use of telemedicine in my medical care ? ? ? ?

## 2021-12-01 NOTE — Telephone Encounter (Signed)
Pt agreeable to plan of care for tele pre op appt 12/03/21 @ 2 pm. Med rec and consent are done. Pt thanked me for the call and the help.  ?

## 2021-12-03 ENCOUNTER — Ambulatory Visit (INDEPENDENT_AMBULATORY_CARE_PROVIDER_SITE_OTHER): Payer: Medicare Other | Admitting: Student

## 2021-12-03 ENCOUNTER — Other Ambulatory Visit: Payer: Self-pay

## 2021-12-03 ENCOUNTER — Encounter: Payer: Self-pay | Admitting: Student

## 2021-12-03 DIAGNOSIS — Z01810 Encounter for preprocedural cardiovascular examination: Secondary | ICD-10-CM

## 2021-12-03 NOTE — Progress Notes (Signed)
? ?Virtual Visit via Telephone Note  ? ?This visit type was conducted due to national recommendations for restrictions regarding the COVID-19 Pandemic (e.g. social distancing) in an effort to limit this patient's exposure and mitigate transmission in our community.  Due to his co-morbid illnesses, this patient is at least at moderate risk for complications without adequate follow up.  This format is felt to be most appropriate for this patient at this time.  The patient did not have access to video technology/had technical difficulties with video requiring transitioning to audio format only (telephone).  All issues noted in this document were discussed and addressed.  No physical exam could be performed with this format.  Please refer to the patient's chart for his  consent to telehealth for [facility]. ?Evaluation Performed:  Preoperative cardiovascular risk assessment ? ?This visit type was conducted due to national recommendations for restrictions regarding the COVID-19 Pandemic (e.g. social distancing).  This format is felt to be most appropriate for this patient at this time.  All issues noted in this document were discussed and addressed.  No physical exam was performed (except for noted visual exam findings with Video Visits).  Please refer to the patient's chart (MyChart message for video visits and phone note for telephone visits) for the patient's consent to telehealth for [facility]. ?_____________  ? ?Date:  12/03/2021  ? ?Patient ID:  Joseph Contreras, Joseph Contreras 02/05/1941, MRN [id number] ?Patient Location:  ?Home ?Provider location:   ?Office ? ?Primary Care Provider:  [facility] ?Primary Cardiologist:  Quay Burow, MD ? ?Chief Complaint  ?  ?Joseph Contreras is a 81 y.o. male with a history of PAD with AAA s/p endovascular stent graft in 2013, renal artery stenosis s/p PTA and stenting of left renal artery in 1996, hypertension, hyperlipidemia, and gout who is pending an excisional biopsy of  right buccal mucosal ulcer and presents today for telephonic preoperative cardiovascular risk assessment. ? ?Past Medical History  ?  ?Past Medical History:  ?Diagnosis Date  ? AAA (abdominal aortic aneurysm) 10/29/11  ? endovascular stent graft  ? Cancer [facility])   ? melanoma, squesmous cell hand, back  ? Gout   ? Gout   ? Hypercholesterolemia   ? Hypertension   ? Kidney atrophy   ? left, known occluded LRA stent  ? Normal coronary arteries 1996  ? low risk Myoview Jan 2013  ? Renal artery stenosis [facility]) April 1996  ? LRA stent- now known to be occluded  ? ?Past Surgical History:  ?Procedure Laterality Date  ? ABDOMINAL AORTIC ENDOVASCULAR STENT GRAFT  Feb 2013  ? CARDIAC CATHETERIZATION  1995  ? normal cornaries  ? EYE SURGERY  2.5.13  ? retinal detachment    ? EYE SURGERY Right December 31, 2014  ? Cataract  ? EYE SURGERY Left Jan 19, 2015  ? Cataract  ? GANGLION CYST EXCISION  79  ? rt wrist  ? LEFT RENAL STENT  1996  ? now occluded  ? TONSILLECTOMY  1949  ? VASECTOMY  09/1988  ? ? ?Allergies ? ?Allergies  ?Allergen Reactions  ? Codeine Nausea And Vomiting  ? ? ?History of Present Illness  ?  ?Joseph Contreras is a 81 y.o. male who presents via audio/video conferencing for a telehealth visit today.  Patient was last seen in cardiology clinic on 01/23/2021 by Dr. Gwenlyn Found.  At that time, Joseph Contreras was doing well without any chest pain or shortness of breath. He is now  pending an excisional biopsy of right buccal mucosal ulcer. Since his last visit, he has been doing well. He denies any chest pain, shortness of breath, orthopnea, PND, palpitations, lightheadedness/dizziness, or syncope. He is able to complete >4.0 METS without any problems.  ? ?Home Medications  ?  ?Prior to Admission medications   ?Medication Sig Start Date End Date Taking? Authorizing Provider  ?allopurinol (ZYLOPRIM) 300 MG tablet Take 300 mg by mouth daily.     [provider]  ?aspirin 325 MG tablet Take 325 mg by mouth daily.    [provider]  ?benazepril (LOTENSIN) 20 MG tablet Take 10 mg by mouth daily.    [provider]  ?bumetanide (BUMEX) 0.5 MG tablet Take 0.5 mg by mouth daily.    [provider]  ?Cholecalciferol (VITAMIN D3) 5000 units CAPS Take 5,000 Units by mouth daily.     [provider]  ?fish oil-omega-3 fatty acids 1000 MG capsule Take 3 g by mouth daily.    [provider]  ?indomethacin (INDOCIN) 50 MG capsule Take 50 mg by mouth as needed.    [provider]  ?metFORMIN (GLUCOPHAGE) 500 MG tablet Take 1,000 mg by mouth daily.    [provider]  ?Multiple Vitamin (MULTIVITAMIN) tablet Take 1 tablet by mouth daily.    [provider]  ?Potassium 99 MG TABS Take 297 mg by mouth daily.     [provider]  ?sildenafil (VIAGRA) 100 MG tablet TAKE ONE TABLET BY MOUTH AS DIRECTED AS NEEDED (TAKE 1 HOUR PRIOR TO SEXUAL ACTIVITY *DO NOT EXCEED 1 DOSE PER 24 HOUR PERIOD*)  START TAKING ONE-HALF TABLETS TO SEE IF THAT WORKS, IF IT DOES THEN CONTINUE JUST  TAKING ONE-HALF TABLETS EACH TIME IT IS NEEDED. IF THIS DOES NOT, THEN START  TAKING 1 FULL TABLET AS NEEDED. (TAKE 1 HOUR PRIOR TO SEXUAL ACTIVITY *DO NOT EXCEED 1 DOSE PER 24 HOUR PERIOD*)  START TAKING ONE-HALF TABLETS TO SEE IF THAT WORKS, IF IT DOES THEN CONTINUE JUST  TAKING ONE-HALF TABLETS EACH TIME IT IS NEEDED. IF THIS DOES NOT, THEN START  TAKING 1 FULL TABLET AS NEEDED. 05/18/21   [provider]  ?simvastatin (ZOCOR) 20 MG tablet Take 20 mg by mouth daily.    [provider]  ?triamcinolone cream (KENALOG) 0.1 % APPLY THIN LAYER TO AFFECTED AREA DAILY AS DIRECTED.  APPLY TO SCALY RASH ON ELBOWS ONCE A DAY. NEVER TO FACE, UNDERARMS, OR  GROIN. AS DIRECTED.  APPLY TO SCALY RASH ON ELBOWS ONCE A DAY. NEVER TO Leontine Locket, OR   GROIN. 12/08/20   [provider]  ? ? ?Physical Exam  ?  ?Vital Signs:  Joseph Contreras does not have vital signs available for review  today. ? ?Given telephonic nature of communication, physical exam is limited: ?Alert and oriented x3. No acute distress. Normal affect. Speech and respirations are unlabored. ? ?Accessory Clinical Findings  ?  ?None ? ?Assessment & Plan  ?  ?Preoperative Cardiovascular Risk Assessment: ?Patient has an upcoming excisional biopsy of buccal mucosal ulcer planned. He is doing well from a cardiac standpoint without any chest pain, acute CHF symptoms, palpitations, or syncope. He is able to complete >4.0 METS without any problems. Therefore, based on ACC/AHA guidelines, patient would be at acceptable risk for the planned procedure without further cardiovascular testing. Per Dr. Gwenlyn Found, okay to hold Aspirin for 7 days as requested but please restart this as soon as safely possible  afterwards. I will route this recommendation to the requesting party via Epic fax function. ? ? ?Time:   ?Today, I have spent 4 minutes and 48 seconds with the patient with telehealth technology discussing medical history, symptoms, and management plan.   ? ? ?Darreld Mclean, PA-C ? ?12/03/2021, 2:12 PM ?

## 2022-05-03 ENCOUNTER — Encounter: Payer: Self-pay | Admitting: Cardiovascular Disease

## 2022-05-03 ENCOUNTER — Ambulatory Visit (INDEPENDENT_AMBULATORY_CARE_PROVIDER_SITE_OTHER): Payer: Medicare Other | Admitting: Cardiovascular Disease

## 2022-05-03 VITALS — BP 124/58 | HR 63 | Ht 69.0 in | Wt 213.0 lb

## 2022-05-03 DIAGNOSIS — I701 Atherosclerosis of renal artery: Secondary | ICD-10-CM

## 2022-05-03 DIAGNOSIS — I10 Essential (primary) hypertension: Secondary | ICD-10-CM | POA: Diagnosis not present

## 2022-05-03 DIAGNOSIS — I7143 Infrarenal abdominal aortic aneurysm, without rupture: Secondary | ICD-10-CM | POA: Diagnosis not present

## 2022-05-03 DIAGNOSIS — E7800 Pure hypercholesterolemia, unspecified: Secondary | ICD-10-CM

## 2022-05-03 NOTE — Assessment & Plan Note (Signed)
History of abdominal aortic aneurysm status post endoluminal stent grafting by Dr. Trula Slade February 2013 which he follows.  He had a negative Myoview stress test prior to that.

## 2022-05-03 NOTE — Assessment & Plan Note (Signed)
History of hyperlipidemia on statin therapy followed by the [facility] in [place].

## 2022-05-03 NOTE — Patient Instructions (Signed)
Medication Instructions:  No changes *If you need a refill on your cardiac medications before your next appointment, please call your pharmacy*   Lab Work: None ordered If you have labs (blood work) drawn today and your tests are completely normal, you will receive your results only by: [place] (if you have MyChart) OR A paper copy in the mail If you have any lab test that is abnormal or we need to change your treatment, we will call you to review the results.   Testing/Procedures: None ordered   Follow-Up: At [facility], you and your health needs are our priority.  As part of our continuing mission to provide you with exceptional heart care, we have created designated Provider Care Teams.  These Care Teams include your primary Cardiologist (physician) and Advanced Practice Providers (APPs -  Physician Assistants and Nurse Practitioners) who all work together to provide you with the care you need, when you need it.  We recommend signing up for the patient portal called "MyChart".  Sign up information is provided on this After Visit Summary.  MyChart is used to connect with patients for Virtual Visits (Telemedicine).  Patients are able to view lab/test results, encounter notes, upcoming appointments, etc.  Non-urgent messages can be sent to your provider as well.   To learn more about what you can do with MyChart, go to [email or web address].    Your next appointment:   12 month(s)  The format for your next appointment:   In Person  Provider:   Quay Burow, MD {    Important Information About [place]

## 2022-05-03 NOTE — Assessment & Plan Note (Signed)
History of left renal artery stented which I performed April 1996.  His last Doppler study in 2016 showed his left renal artery was occluded and his right renal arteries was widely patent.

## 2022-05-03 NOTE — Progress Notes (Signed)
05/03/2022 Joseph Contreras   [birth year]  [id number]  Primary Physician Clinic, [facility] Primary Cardiologist: Lorretta Harp MD FACP, [place], [place], [place]  HPI:  Joseph Contreras is a 81 y.o.  mildly overweight married Caucasian male, father of 72 and grandfather of 5 grandchildren, whom I last saw 01/23/2021.Marland Kitchen  He is accompanied by his wife Joseph Contreras today.  I performed PTA and stenting of his left renal artery back in April 1996. His other problems include hypertension and hyperlipidemia. We have been following his renal Doppler studies which have since shown occlusion of his left renal artery approximately 7 years ago with a patent right renal artery. We have also been following his abdominal aorta, which had grown to 5.5 cm. I referred him to Dr. Annamarie Major, who performed abdominal stent grafting on him February of 2013.  He had a negative Myoview as part of that.  Preprocedure work-up which was negative.   Since I saw him a year ago he continues to do well.  Dr. Trula Slade follows his endoluminal stent graft.  He denies chest pain or shortness of breath.   Current Meds  Medication Sig   allopurinol (ZYLOPRIM) 300 MG tablet Take 300 mg by mouth daily.    aspirin 325 MG tablet Take 325 mg by mouth daily.   benazepril (LOTENSIN) 20 MG tablet Take 10 mg by mouth daily.   bumetanide (BUMEX) 0.5 MG tablet Take 0.5 mg by mouth daily.   Cholecalciferol (VITAMIN D3) 5000 units CAPS Take 5,000 Units by mouth daily.    fish oil-omega-3 fatty acids 1000 MG capsule Take 3 g by mouth daily.   indomethacin (INDOCIN) 50 MG capsule Take 50 mg by mouth as needed.   metFORMIN (GLUCOPHAGE) 500 MG tablet Take 1,000 mg by mouth daily.   Multiple Vitamin (MULTIVITAMIN) tablet Take 1 tablet by mouth daily.   Potassium 99 MG TABS Take 297 mg by mouth daily.    simvastatin (ZOCOR) 20 MG tablet Take 20 mg by mouth daily.   triamcinolone cream (KENALOG) 0.1 % APPLY THIN LAYER TO AFFECTED AREA DAILY AS DIRECTED.   APPLY TO SCALY RASH ON ELBOWS ONCE A DAY. NEVER TO FACE, UNDERARMS, OR  GROIN. AS DIRECTED.  APPLY TO SCALY RASH ON ELBOWS ONCE A DAY. NEVER TO FACE, UNDERARMS, OR   GROIN.     Allergies  Allergen Reactions   Amoxicillin Diarrhea and Nausea Only    Amoxicillin-Clav   Codeine Nausea And Vomiting    Social History   Socioeconomic History   Marital status: Married    Spouse name: Not on file   Number of children: Not on file   Years of education: Not on file   Highest education level: Not on file  Occupational History   Not on file  Tobacco Use   Smoking status: Former    Packs/day: 1.00    Types: Cigarettes    Quit date: 10/05/1966    Years since quitting: 55.6   Smokeless tobacco: Never  Vaping Use   Vaping Use: Never used  Substance and Sexual Activity   Alcohol use: No   Drug use: No   Sexual activity: Not on file  Other Topics Concern   Not on file  Social History Narrative   Not on file   Social Determinants of Health   Financial Resource Strain: Not on file  Food Insecurity: Not on file  Transportation Needs: Not on file  Physical Activity: Not on file  Stress:  Not on file  Social Connections: Not on file  Intimate Partner Violence: Not on file     Review of Systems: General: negative for chills, fever, night sweats or weight changes.  Cardiovascular: negative for chest pain, dyspnea on exertion, edema, orthopnea, palpitations, paroxysmal nocturnal dyspnea or shortness of breath Dermatological: negative for rash Respiratory: negative for cough or wheezing Urologic: negative for hematuria Abdominal: negative for nausea, vomiting, diarrhea, bright red blood per rectum, melena, or hematemesis Neurologic: negative for visual changes, syncope, or dizziness All other systems reviewed and are otherwise negative except as noted above.    Blood pressure (!) 124/58, pulse 63, height '5\' 9"'$  (1.753 m), weight 213 lb (96.6 kg), SpO2 93 %.  General appearance: alert  and no distress Neck: no adenopathy, no carotid bruit, no JVD, supple, symmetrical, trachea midline, and thyroid not enlarged, symmetric, no tenderness/mass/nodules Lungs: clear to auscultation bilaterally Heart: regular rate and rhythm, S1, S2 normal, no murmur, click, rub or gallop Extremities: extremities normal, atraumatic, no cyanosis or edema Pulses: 2+ and symmetric Skin: Skin color, texture, turgor normal. No rashes or lesions Neurologic: Grossly normal  EKG sinus rhythm at 63 without ST or T wave changes.  Personally reviewed this EKG.  ASSESSMENT AND PLAN:   Hypercholesterolemia History of hyperlipidemia on statin therapy followed by the [facility] in [place].  AAA (abdominal aortic aneurysm)- S/P Ao stent graft 2/13 History of abdominal aortic aneurysm status post endoluminal stent grafting by Dr. Trula Slade February 2013 which he follows.  He had a negative Myoview stress test prior to that.  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  His last Doppler study in 2016 showed his left renal artery was occluded and his right renal arteries was widely patent.  Essential hypertension History of essential hypertension blood pressure measured today at 124/58.  He is on Lotensin.     Lorretta Harp MD FACP,FACC,FAHA, [facility] 05/03/2022 1:22 PM

## 2022-05-03 NOTE — Assessment & Plan Note (Signed)
History of essential hypertension blood pressure measured today at 124/58.  He is on Lotensin.

## 2022-06-23 DIAGNOSIS — Z23 Encounter for immunization: Secondary | ICD-10-CM | POA: Diagnosis not present

## 2022-08-23 ENCOUNTER — Other Ambulatory Visit: Payer: Self-pay | Admitting: *Deleted

## 2022-08-23 DIAGNOSIS — I714 Abdominal aortic aneurysm, without rupture, unspecified: Secondary | ICD-10-CM

## 2022-08-30 ENCOUNTER — Ambulatory Visit (HOSPITAL_COMMUNITY)
Admission: RE | Admit: 2022-08-30 | Discharge: 2022-08-30 | Disposition: A | Discharge status: Home / Self Care | Payer: Medicare Other | Source: Ambulatory Visit | Attending: Vascular Surgery | Admitting: Vascular Surgery

## 2022-08-30 ENCOUNTER — Ambulatory Visit (INDEPENDENT_AMBULATORY_CARE_PROVIDER_SITE_OTHER): Payer: Medicare Other | Admitting: Physician Assistant

## 2022-08-30 VITALS — BP 149/87 | HR 66 | Temp 97.7°F | Resp 16 | Ht 68.0 in | Wt 213.0 lb

## 2022-08-30 DIAGNOSIS — I714 Abdominal aortic aneurysm, without rupture, unspecified: Secondary | ICD-10-CM | POA: Insufficient documentation

## 2022-08-30 DIAGNOSIS — I701 Atherosclerosis of renal artery: Secondary | ICD-10-CM

## 2022-08-30 NOTE — Progress Notes (Signed)
Office Note     CC:  follow up Requesting Provider:  Clinic, [facility]  HPI: Joseph Contreras is a 81 y.o. (03/17/1941) male who presents for surveillance of endovascular repair of abdominal aortic aneurysm.  This was performed by Dr. Trula Slade in February 2013.  He was noted to have a type II endoleak several years ago on duplex.  No intervention was necessary and patient has been followed with ongoing surveillance.  Over the past several visits he has had no evidence of endoleak.  He denies any new or changing abdominal or back pain.  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  He denies any claudication, rest pain, or tissue loss of bilateral lower extremities.  He is on aspirin and a statin daily.  He denies tobacco use.  He follows regularly with his PCP for chronic medical conditions including type 2 diabetes mellitus with a recent hemoglobin A1c of 6.6.   Past Medical History:  Diagnosis Date   AAA (abdominal aortic aneurysm) ([place]) 10/29/11   endovascular stent graft   Cancer ([place])    melanoma, squesmous cell hand, back   Gout    Gout    Hypercholesterolemia    Hypertension    Kidney atrophy    left, known occluded LRA stent   Normal coronary arteries 1996   low risk Myoview Jan 2013   Renal artery stenosis [facility]) April 1996   LRA stent- now known to be occluded    Past Surgical History:  Procedure Laterality Date   ABDOMINAL AORTIC ENDOVASCULAR STENT GRAFT  Feb 2013   CARDIAC CATHETERIZATION  1995   normal cornaries   EYE SURGERY  2.5.13   retinal detachment     EYE SURGERY Right December 31, 2014   Cataract   EYE SURGERY Left Jan 19, 2015   Cataract   GANGLION CYST EXCISION  79   rt wrist   LEFT RENAL STENT  1996   now occluded   [place]  09/1988    Social History   Socioeconomic History   Marital status: Married    Spouse name: Not on file   Number of children: Not on file   Years of  education: Not on file   Highest education level: Not on file  Occupational History   Not on file  Tobacco Use   Smoking status: Former    Packs/day: 1.00    Types: Cigarettes    Quit date: 10/05/1966    Years since quitting: 55.9   Smokeless tobacco: Never  Vaping Use   Vaping Use: Never used  Substance and Sexual Activity   Alcohol use: No   Drug use: No   Sexual activity: Not on file  Other Topics Concern   Not on file  Social History Narrative   Not on file   Social Determinants of Health   Financial Resource Strain: Not on file  Food Insecurity: Not on file  Transportation Needs: Not on file  Physical Activity: Not on file  Stress: Not on file  Social Connections: Not on file  Intimate Partner Violence: Not on file    Family History  Problem Relation Age of Onset   Heart disease Father    Heart attack Father    Diabetes Mother    Hyperlipidemia Sister    Hypertension Sister    Hyperlipidemia Brother    Hypertension Brother     Current Outpatient Medications  Medication Sig Dispense Refill   allopurinol (ZYLOPRIM) 300 MG tablet Take 300 mg by mouth daily.      aspirin 325 MG tablet Take 325 mg by mouth daily.     benazepril (LOTENSIN) 20 MG tablet Take 10 mg by mouth daily.     bumetanide (BUMEX) 0.5 MG tablet Take 0.5 mg by mouth daily.     Cholecalciferol (VITAMIN D3) 5000 units CAPS Take 5,000 Units by mouth daily.      fish oil-omega-3 fatty acids 1000 MG capsule Take 3 g by mouth daily.     indomethacin (INDOCIN) 50 MG capsule Take 50 mg by mouth as needed.     metFORMIN (GLUCOPHAGE) 500 MG tablet Take 1,000 mg by mouth daily.     Multiple Vitamin (MULTIVITAMIN) tablet Take 1 tablet by mouth daily.     Potassium 99 MG TABS Take 297 mg by mouth daily.      simvastatin (ZOCOR) 20 MG tablet Take 20 mg by mouth daily.     triamcinolone cream (KENALOG) 0.1 % APPLY THIN LAYER TO AFFECTED AREA DAILY AS DIRECTED.  APPLY TO SCALY RASH ON ELBOWS ONCE A DAY.  NEVER TO FACE, UNDERARMS, OR  GROIN. AS DIRECTED.  APPLY TO SCALY RASH ON ELBOWS ONCE A DAY. NEVER TO FACE, UNDERARMS, OR   GROIN.     No current facility-administered medications for this visit.    Allergies  Allergen Reactions   Amoxicillin Diarrhea and Nausea Only    Amoxicillin-Clav   Codeine Nausea And Vomiting     REVIEW OF SYSTEMS:   '[X]'$  denotes positive finding, '[ ]'$  denotes negative finding Cardiac  Comments:  Chest pain or chest pressure:    Shortness of breath upon exertion:    Short of breath when lying flat:    Irregular heart rhythm:        Vascular    Pain in calf, thigh, or hip brought on by ambulation:    Pain in feet at night that wakes you up from your sleep:     Blood clot in your veins:    Leg swelling:         Pulmonary    Oxygen at home:    Productive cough:     Wheezing:         Neurologic    Sudden weakness in arms or legs:     Sudden numbness in arms or legs:     Sudden onset of difficulty speaking or slurred speech:    Temporary loss of vision in one eye:     Problems with dizziness:         Gastrointestinal    Blood in stool:     Vomited blood:         Genitourinary    Burning when urinating:     Blood in urine:        Psychiatric    Major depression:         Hematologic    Bleeding problems:    Problems with blood clotting too easily:        Skin    Rashes or ulcers:        Constitutional    Fever or chills:      PHYSICAL EXAMINATION:  Vitals:   08/30/22 0821  BP: (!) 149/87  Pulse: 66  Resp: 16  Temp: 97.7 F (36.5 C)  TempSrc: Temporal  SpO2: 95%  Weight: 213 lb (96.6 kg)  Height: '5\' 8"'$  (1.727 m)  General:  WDWN in NAD; vital signs documented above Gait: Not observed HENT: WNL, normocephalic Pulmonary: normal non-labored breathing Cardiac: regular HR Abdomen: soft, NT, no masses Skin: without rashes Vascular Exam/Pulses:  Right Left  Radial 2+ (normal) 2+ (normal)  DP 2+ (normal) absent    Extremities: without ischemic changes, without Gangrene , without cellulitis; without open wounds;  Musculoskeletal: no muscle wasting or atrophy  Neurologic: A&O X 3;  No focal weakness or paresthesias are detected Psychiatric:  The pt has Normal affect.   Non-Invasive Vascular Imaging:   AAA max diameter 5.22 cm Essentially unchanged from prior study demonstrating 5.16 cm    ASSESSMENT/PLAN:: 81 y.o. male here for follow up for surveillance of EVAR  -Subjectively no abdominal or back pain since last office visit -AAA sac essentially unchanged over the past year measuring 5.22 cm in max diameter today.  No endoleak noted. -Repeat EVAR duplex in 1 year -Patient will continue to follow regularly with his PCP for management of chronic medical conditions including type 2 diabetes mellitus   Dagoberto Ligas, PA-C [facility] [phone]  Clinic MD:   Trula Slade

## 2023-05-24 ENCOUNTER — Ambulatory Visit: Payer: Medicare Other | Attending: Cardiovascular Disease | Admitting: Cardiovascular Disease

## 2023-05-24 ENCOUNTER — Encounter: Payer: Self-pay | Admitting: Cardiovascular Disease

## 2023-05-24 VITALS — BP 122/64 | HR 70 | Ht 69.0 in | Wt 221.0 lb

## 2023-05-24 DIAGNOSIS — E7800 Pure hypercholesterolemia, unspecified: Secondary | ICD-10-CM | POA: Diagnosis not present

## 2023-05-24 DIAGNOSIS — I7143 Infrarenal abdominal aortic aneurysm, without rupture: Secondary | ICD-10-CM | POA: Diagnosis not present

## 2023-05-24 DIAGNOSIS — I10 Essential (primary) hypertension: Secondary | ICD-10-CM | POA: Insufficient documentation

## 2023-05-24 NOTE — Patient Instructions (Signed)

## 2023-05-24 NOTE — Assessment & Plan Note (Signed)
History of hyperlipidemia on statin therapy [facility] 

## 2023-05-24 NOTE — Assessment & Plan Note (Signed)
History of essential hypertension with blood pressure measured today at 122/64.  He is on benazepril.

## 2023-05-24 NOTE — Progress Notes (Signed)
05/24/2023 Joseph Contreras   06-17-1941  [id number]  Primary Physician Clinic, [facility] Primary Cardiologist: Runell Gess MD FACP, [place], [place], [place]  HPI:  Joseph Contreras is a 81 y.o.  mildly overweight married Caucasian male, father of 2 and grandfather of 5 grandchildren, whom I last saw 05/03/2022.Marland Kitchen  He is accompanied by his wife Joseph Contreras today.  I performed PTA and stenting of his left renal artery back in April 1996. His other problems include hypertension and hyperlipidemia. We have been following his renal Doppler studies which have since shown occlusion of his left renal artery approximately 7 years ago with a patent right renal artery. We have also been following his abdominal aorta, which had grown to 5.5 cm. I referred him to Dr. Durene Cal, who performed abdominal stent grafting on him February of 2013.  He had a negative Myoview as part of that.  Preprocedure work-up which was negative.   Since I saw him a year ago he continues to do well.  Dr. Myra Gianotti follows his endoluminal stent graft.  He denies chest pain or shortness of breath.   No outpatient medications have been marked as taking for the 05/24/23 encounter (Office Visit) with Runell Gess, MD.     Allergies  Allergen Reactions   Amoxicillin Diarrhea and Nausea Only    Amoxicillin-Clav   Codeine Nausea And Vomiting    Social History   Socioeconomic History   Marital status: Married    Spouse name: Not on file   Number of children: Not on file   Years of education: Not on file   Highest education level: Not on file  Occupational History   Not on file  Tobacco Use   Smoking status: Former    Current packs/day: 0.00    Types: Cigarettes    Quit date: 10/05/1966    Years since quitting: 56.6   Smokeless tobacco: Never  Vaping Use   Vaping status: Never Used  Substance and Sexual Activity   Alcohol use: No   Drug use: No   Sexual activity: Not on file  Other Topics Concern   Not on file   Social History Narrative   Not on file   Social Determinants of Health   Financial Resource Strain: Not on file  Food Insecurity: Not on file  Transportation Needs: Not on file  Physical Activity: Not on file  Stress: Not on file  Social Connections: Not on file  Intimate Partner Violence: Not on file     Review of Systems: General: negative for chills, fever, night sweats or weight changes.  Cardiovascular: negative for chest pain, dyspnea on exertion, edema, orthopnea, palpitations, paroxysmal nocturnal dyspnea or shortness of breath Dermatological: negative for rash Respiratory: negative for cough or wheezing Urologic: negative for hematuria Abdominal: negative for nausea, vomiting, diarrhea, bright red blood per rectum, melena, or hematemesis Neurologic: negative for visual changes, syncope, or dizziness All other systems reviewed and are otherwise negative except as noted above.    Blood pressure 122/64, pulse 70, height 5\' 9"  (1.753 m), weight 221 lb (100.2 kg), SpO2 96%.  General appearance: alert and no distress Neck: no adenopathy, no carotid bruit, no JVD, supple, symmetrical, trachea midline, and thyroid not enlarged, symmetric, no tenderness/mass/nodules Lungs: clear to auscultation bilaterally Heart: Regular rate and rhythm without murmurs gallops rubs or clicks Extremities: extremities normal, atraumatic, no cyanosis or edema Pulses: 2+ and symmetric Skin: Skin color, texture, turgor normal. No rashes or lesions Neurologic:  Grossly normal  EKG EKG Interpretation Date/Time:  Tuesday May 24 2023 10:34:56 EDT Ventricular Rate:  70 PR Interval:  200 QRS Duration:  90 QT Interval:  398 QTC Calculation: 429 R Axis:   -4  Text Interpretation: Normal sinus rhythm Possible Inferior infarct , age undetermined When compared with ECG of 24-Aug-2016 11:49, PREVIOUS ECG IS PRESENT Confirmed by Nanetta Batty [phone]) on 05/24/2023 10:54:03 AM    ASSESSMENT AND  PLAN:   Hypercholesterolemia History of hyperlipidemia on statin therapy followed by the [facility].  AAA (abdominal aortic aneurysm)- S/P Ao stent graft 2/13 History of abdominal aortic aneurysm status post endoluminal stent grafting by Dr. Myra Gianotti February 2013 which she follows.  Essential hypertension History of essential hypertension with blood pressure measured today at 122/64.  He is on benazepril.     Runell Gess MD FACP,FACC,FAHA, [facility] 05/24/2023 11:00 AM

## 2023-05-24 NOTE — Assessment & Plan Note (Signed)
History of abdominal aortic aneurysm status post endoluminal stent grafting by Dr. Myra Gianotti February 2013 which she follows.

## 2023-06-20 DIAGNOSIS — Z23 Encounter for immunization: Secondary | ICD-10-CM | POA: Diagnosis not present

## 2023-08-22 ENCOUNTER — Other Ambulatory Visit: Payer: Self-pay | Admitting: *Deleted

## 2023-08-22 DIAGNOSIS — Z9889 Other specified postprocedural states: Secondary | ICD-10-CM

## 2023-09-05 ENCOUNTER — Ambulatory Visit (INDEPENDENT_AMBULATORY_CARE_PROVIDER_SITE_OTHER): Payer: Medicare Other | Admitting: Physician Assistant

## 2023-09-05 ENCOUNTER — Ambulatory Visit (HOSPITAL_COMMUNITY)
Admission: RE | Admit: 2023-09-05 | Discharge: 2023-09-05 | Disposition: A | Discharge status: Home / Self Care | Payer: Medicare Other | Source: Ambulatory Visit | Attending: Surgery | Admitting: Surgery

## 2023-09-05 VITALS — BP 161/90 | HR 61 | Temp 98.0°F | Resp 22 | Ht 69.0 in | Wt 219.5 lb

## 2023-09-05 DIAGNOSIS — Z9889 Other specified postprocedural states: Secondary | ICD-10-CM

## 2023-09-05 DIAGNOSIS — I714 Abdominal aortic aneurysm, without rupture, unspecified: Secondary | ICD-10-CM

## 2023-09-05 DIAGNOSIS — R143 Flatulence: Secondary | ICD-10-CM | POA: Diagnosis not present

## 2023-09-05 DIAGNOSIS — I10 Essential (primary) hypertension: Secondary | ICD-10-CM | POA: Diagnosis not present

## 2023-09-05 DIAGNOSIS — E669 Obesity, unspecified: Secondary | ICD-10-CM | POA: Insufficient documentation

## 2023-09-05 DIAGNOSIS — E785 Hyperlipidemia, unspecified: Secondary | ICD-10-CM | POA: Insufficient documentation

## 2023-09-05 NOTE — Progress Notes (Signed)
Office Note   History of Present Illness   Joseph Contreras is a 82 y.o. (1941/02/05) male who presents for follow up of AAA.  He has undergone endovascular repair of abdominal aortic aneurysm in February 2013 by Dr. Myra Gianotti.  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  No intervention was necessary and surveillance was continued.  Over his last several visits he has had no endoleak.  He returns today for follow-up.  He denies any abdominal, back, or chest pain.  He also denies any claudication, rest pain, or tissue loss.  He reports not being able to walk is much as he would like due to his hip arthritis.   Current Outpatient Medications  Medication Sig Dispense Refill   allopurinol (ZYLOPRIM) 300 MG tablet Take 300 mg by mouth daily.      aspirin 325 MG tablet Take 325 mg by mouth daily.     benazepril (LOTENSIN) 20 MG tablet Take 10 mg by mouth daily.     bumetanide (BUMEX) 0.5 MG tablet Take 0.5 mg by mouth daily.     Cholecalciferol (VITAMIN D3) 5000 units CAPS Take 5,000 Units by mouth daily.      fish oil-omega-3 fatty acids 1000 MG capsule Take 3 g by mouth daily.     indomethacin (INDOCIN) 50 MG capsule Take 50 mg by mouth as needed.     metFORMIN (GLUCOPHAGE) 500 MG tablet Take 1,000 mg by mouth daily.     Multiple Vitamin (MULTIVITAMIN) tablet Take 1 tablet by mouth daily.     Potassium 99 MG TABS Take 297 mg by mouth daily.      simvastatin (ZOCOR) 20 MG tablet Take 20 mg by mouth daily.     triamcinolone cream (KENALOG) 0.1 % APPLY THIN LAYER TO AFFECTED AREA DAILY AS DIRECTED.  APPLY TO SCALY RASH ON ELBOWS ONCE A DAY. NEVER TO FACE, UNDERARMS, OR  GROIN. AS DIRECTED.  APPLY TO SCALY RASH ON ELBOWS ONCE A DAY. NEVER TO FACE, UNDERARMS, OR   GROIN.     No current facility-administered medications for this visit.    REVIEW OF SYSTEMS (negative unless checked):   Cardiac:  []  Chest pain or chest pressure? []  Shortness of breath upon activity? []   Shortness of breath when lying flat? []  Irregular heart rhythm?  Vascular:  []  Pain in calf, thigh, or hip brought on by walking? []  Pain in feet at night that wakes you up from your sleep? []  Blood clot in your veins? []  Leg swelling?  Pulmonary:  []  Oxygen at home? []  Productive cough? []  Wheezing?  Neurologic:  []  Sudden weakness in arms or legs? []  Sudden numbness in arms or legs? []  Sudden onset of difficult speaking or slurred speech? []  Temporary loss of vision in one eye? []  Problems with dizziness?  Gastrointestinal:  []  Blood in stool? []  Vomited blood?  Genitourinary:  []  Burning when urinating? []  Blood in urine?  Psychiatric:  []  Major depression  Hematologic:  []  Bleeding problems? []  Problems with blood clotting?  Dermatologic:  []  Rashes or ulcers?  Constitutional:  []  Fever or chills?  Ear/Nose/Throat:  []  Change in hearing? []  Nose bleeds? []  Sore throat?  Musculoskeletal:  []  Back pain? [x]  Joint pain? []  Muscle pain?   Physical Examination   Vitals:   09/05/23 1015  BP: (!) 161/90  Pulse: 61  Resp: (!) 22  Temp: 98 F (36.7 C)  TempSrc: Temporal  SpO2:  96%  Weight: 219 lb 8 oz (99.6 kg)  Height: 5\' 9"  (1.753 m)   Body mass index is 32.41 kg/m.  General:  WDWN in NAD; vital signs documented above Gait: Not observed HENT: WNL, normocephalic Pulmonary: normal non-labored breathing , without rales, rhonchi,  wheezing Cardiac: regular Abdomen: soft, NT, no masses Skin: without rashes Vascular Exam/Pulses: BLE warm and well perfused  Extremities: without ischemic changes, without gangrene , without cellulitis; without open wounds;  Musculoskeletal: no muscle wasting or atrophy  Neurologic: A&O X 3;  No focal weakness or paresthesias are detected Psychiatric:  The pt has Normal affect.   Non-Invasive Vascular Imaging   EVAR Duplex (09/05/2023) Current size: 5.02 cm Previous size: 5.22 cm (08/30/2022) R CIA: 1.86  cm L CIA: 1.55 cm   Medical Decision Making   Joseph Contreras is a 82 y.o. (29-Jun-1941) male who presents for surveillance of AAA  Based on this patient's duplex, his AAA is stable in size at 5.02 cm.  There is no evidence of endoleak on duplex He remains free of abdominal, back, or chest pain.  His lower extremities are warm and well-perfused He can follow-up with our office in 1 year with repeat EVAR duplex   Loel Dubonnet PA-C [facility] [place] Office: [phone]  Call MD: Karin Lieu

## 2023-09-08 ENCOUNTER — Other Ambulatory Visit: Payer: Self-pay

## 2023-09-08 DIAGNOSIS — I714 Abdominal aortic aneurysm, without rupture, unspecified: Secondary | ICD-10-CM

## 2023-09-30 ENCOUNTER — Ambulatory Visit (INDEPENDENT_AMBULATORY_CARE_PROVIDER_SITE_OTHER): Payer: Medicare Other | Admitting: Podiatry

## 2023-09-30 DIAGNOSIS — L84 Corns and callosities: Secondary | ICD-10-CM

## 2023-09-30 DIAGNOSIS — E1151 Type 2 diabetes mellitus with diabetic peripheral angiopathy without gangrene: Secondary | ICD-10-CM | POA: Diagnosis not present

## 2023-09-30 NOTE — Progress Notes (Signed)
 Subjective:  Patient ID: Joseph Contreras, male    DOB: [birth year],  MRN: [id number]  Joseph Contreras presents to clinic today for:  Chief Complaint  Patient presents with   Callouses    Bilat callouses, on the lateral sides. There is also a red spot on left at the 4th and 5th toes.  A1c 6.1 ASA 325   Patient presents with his wife today with concern of painful bilateral plantar calluses.  They have been applying acid to the area to try to resolve them.  Sometimes they are able to peel some skin away to provide relief.  Denies ulceration  PCP was last seen around 08/03/2023.  Past Medical History:  Diagnosis Date   AAA (abdominal aortic aneurysm) (HCC) 10/29/11   endovascular stent graft   Cancer (HCC)    melanoma, squesmous cell hand, back   Gout    Gout    Hypercholesterolemia    Hypertension    Kidney atrophy    left, known occluded LRA stent   Normal coronary arteries 1996   low risk Myoview  Jan 2013   Renal artery stenosis [facility]) April 1996   LRA stent- now known to be occluded    Past Surgical History:  Procedure Laterality Date   ABDOMINAL AORTIC ENDOVASCULAR STENT GRAFT  Feb 2013   CARDIAC CATHETERIZATION  1995   normal cornaries   EYE SURGERY  2.5.13   retinal detachment     EYE SURGERY Right December 31, 2014   Cataract   EYE SURGERY Left Jan 19, 2015   Cataract   GANGLION CYST EXCISION  79   rt wrist   LEFT RENAL STENT  1996   now occluded   TONSILLECTOMY  1949   VASECTOMY  09/1988    Allergies  Allergen Reactions   Amoxicillin Diarrhea and Nausea Only    Amoxicillin-Clav   Codeine Nausea And Vomiting   Objective:  Joseph Contreras is a pleasant 83 y.o. male in NAD. AAO x 3.  Vascular Examination: Capillary refill time is delayed to toes bilateral.  Trace palpable pedal pulses b/l LE. Digital hair sparse b/l. No pedal edema b/l. Skin temperature gradient WNL b/l. No varicosities b/l. No cyanosis or clubbing noted b/l.   Dermatological Examination: Pedal  skin with decreased turgor, texture and tone b/l. No open wounds. No interdigital macerations b/l.   Hyperkeratotic lesion present, with pain on palpation, located submet 5 bilateral.  Nails have been recently trimmed  Neurological Examination: Protective sensation intact with Semmes-Weinstein 10 gram monofilament b/l LE.   Assessment/Plan: 1. Callus of foot   2. Type II diabetes mellitus with peripheral circulatory disorder (HCC)     FOR HOME USE ONLY DME DIABETIC SHOE  The hyperkeratotic lesions x 2 were sharply debrided/shaved with sterile #313 blade.  Will get the patient set up for diabetic shoe consult with our pedorthist.  He will need to have custom diabetic insoles made to offload and accommodate the plantarflexed fifth metatarsal heads so that we can offload these areas and prevent the callus formation.  They were advised to avoid using the acid plaster pads on the calluses as this could create a wound or increased scar tissue development in the area.  Return in about 3 months (around 12/29/2023) for Calluses / RFC.   Awanda CHARM Imperial, DPM, FACFAS [facility]     [address]  [place], [place] [address]                Office [phone]  Fax [phone]

## 2023-11-25 ENCOUNTER — Telehealth: Payer: Self-pay | Admitting: Cardiovascular Disease

## 2023-11-25 NOTE — Telephone Encounter (Signed)
 Wife [name]) stated patient had an eye exam this morning and wants to get order for patient to have an echocardiogram.

## 2023-11-25 NOTE — Telephone Encounter (Signed)
 Patient identification verified by 2 forms. Marilynn Rail, RN    Called and spoke to patient wife Mardene Sayer states:   -patient had a eye exam today   -Eye exam showed blockage in eye   -Eye doctor wants patient to follow up with Cardiology, Neurology and PCP   -Eye provider wants patient to have Echo and evaluation of carotid artery   -Eye provider is concerned about possibility of stroke  Patient scheduled for OV 3/14 at 2:15pm  [name] states she will attempt to obtain records for OV  [name] has no further questions at this time

## 2023-12-02 ENCOUNTER — Encounter: Payer: Self-pay | Admitting: Emergency Medicine

## 2023-12-02 ENCOUNTER — Ambulatory Visit: Attending: Emergency Medicine | Admitting: Emergency Medicine

## 2023-12-02 ENCOUNTER — Telehealth: Payer: Self-pay | Admitting: *Deleted

## 2023-12-02 VITALS — BP 128/74 | HR 55 | Ht 68.0 in | Wt 219.0 lb

## 2023-12-02 DIAGNOSIS — I701 Atherosclerosis of renal artery: Secondary | ICD-10-CM | POA: Diagnosis not present

## 2023-12-02 DIAGNOSIS — I10 Essential (primary) hypertension: Secondary | ICD-10-CM | POA: Insufficient documentation

## 2023-12-02 DIAGNOSIS — H34219 Partial retinal artery occlusion, unspecified eye: Secondary | ICD-10-CM | POA: Insufficient documentation

## 2023-12-02 DIAGNOSIS — E7800 Pure hypercholesterolemia, unspecified: Secondary | ICD-10-CM | POA: Diagnosis not present

## 2023-12-02 NOTE — Progress Notes (Addendum)
 Cardiology Office Note:    Date:  12/02/2023  ID:  Joseph Contreras, DOB 04/16/1941, MRN [id number] PCP: Clinic, [facility] Providers Cardiologist:  Nanetta Batty, MD       Patient Profile:      Chief Complaint: [facility]  History of Present Illness:  Joseph Contreras is a 83 y.o. male with visit-pertinent history of hypertension, hyperlipidemia, renal artery stenosis, infrarenal AAA s/p endoluminal stent grafting  He has history of PTA and stenting to his left renal artery in April 1996.  He has been following with Dr. Alger Simons renal artery stenosis with his most recent Doppler studies had shown occlusion of his left femoral artery approximately 7 years ago with a patent right renal artery.  He also has history of infrarenal abdominal aortic aneurysm s/p endoluminal stent grafting by Dr. Myra Gianotti on February 2013.  Most recent EVAR duplex on 09/05/2023 shows patent endovascular aneurysm repair with no evidence of endoleak.  He was last seen in office on 05/24/2023 by Dr. Allyson Sabal.  He was doing well at the time without any cardiovascular concerns or complaints.  He was to follow-up in 1 year.   After thorough chart review through Care Everywhere on [place] notes.  It seems the [place] has been working patient up for Hollenhorst plaque OD as well as small branch retinal vein occlusion after a recent eye examination from his ophthalmologist.  There was concerns regarding amaurosis fugax which initiated a CVA workup.  Clopidogrel 75 mg daily was added in addition to his Aspirin 325 mg daily.  He was notified to reach out to cardiology service for an outpatient echocardiogram.  He also had a carotid [facility] showing atherosclerosis with no evidence for hemodynamically significant stenosis.  Also with an recent LDL of 96 and above his goal of less than 70 70 he was recently switched to Crestor 20 mg from simvastatin 20 mg.   Discussed  the use of AI scribe software for clinical note transcription with the patient, who gave verbal consent to proceed.  The patient comes into the office today with his wife who was sent here for acute visit from the [place] center. The patient is without any acute cardiovascular concerns or complaints at this time and is overall completely asymptomatic.    He is entirely unsure why he is here and he notes "I thought I was supposed to be here for an echocardiogram, did not realize that I had made an office visit"  Today he denies any amaurosis fugax, vision deficits, chest pains, exertional angina, dyspnea, tachycardia, palpitations, syncope, near syncope, melena, hematochezia, claudication, orthopnea, PND.    Review of systems:  Please see the history of present illness. All other systems are reviewed and otherwise negative.     Home Medications:    Current Meds  Medication Sig   allopurinol (ZYLOPRIM) 300 MG tablet Take 300 mg by mouth daily.    aspirin 325 MG tablet Take 325 mg by mouth daily.   benazepril (LOTENSIN) 20 MG tablet Take 10 mg by mouth daily.   bumetanide (BUMEX) 0.5 MG tablet Take 0.5 mg by mouth daily.   Cholecalciferol (VITAMIN D3) 5000 units CAPS Take 5,000 Units by mouth daily.    empagliflozin (JARDIANCE) 25 MG TABS tablet Take 25 mg by mouth daily.   fish oil-omega-3 fatty acids 1000 MG capsule Take 3 g by mouth daily.   indomethacin (INDOCIN) 50 MG capsule Take  50 mg by mouth as needed.   Multiple Vitamin (MULTIVITAMIN) tablet Take 1 tablet by mouth daily.   Potassium 99 MG TABS Take 297 mg by mouth daily.    simvastatin (ZOCOR) 20 MG tablet Take 20 mg by mouth daily.   triamcinolone cream (KENALOG) 0.1 % APPLY THIN LAYER TO AFFECTED AREA DAILY AS DIRECTED.  APPLY TO SCALY RASH ON ELBOWS ONCE A DAY. NEVER TO FACE, UNDERARMS, OR  GROIN. AS DIRECTED.  APPLY TO SCALY RASH ON ELBOWS ONCE A DAY. NEVER TO FACE, UNDERARMS, OR   GROIN.   Studies Reviewed:   EKG  Interpretation Date/Time:  Friday December 02 2023 14:09:19 EDT Ventricular Rate:  55 PR Interval:  198 QRS Duration:  90 QT Interval:  420 QTC Calculation: 401 R Axis:   8  Text Interpretation: Sinus bradycardia Possible Inferior infarct (cited on or before 24-May-2023) When compared with ECG of 24-May-2023 10:34, No significant change was found Confirmed by Rise Paganini [phone]) on 12/02/2023 4:35:24 PM   Bilateral carotid ultrasound 11/29/2023 (done at [facility]) Mixed plaque is seen in the right CCA with calcified plaque noted  in the bulb and proximal right ICA. Calcified plaque is seen in  the left bulb/proximal left ICA. The vertebral arteries are  patent and demonstrate antegrade flow.   Impression: Atherosclerosis. No evidence for hemodynamically significant stenosis.  Risk Assessment/Calculations:             Physical Exam:   VS:  BP 128/74 (BP Location: Right Arm, Patient Position: Sitting, Cuff Size: Normal)   Pulse (!) 55   Ht 5\' 8"  (1.727 m)   Wt 219 lb (99.3 kg)   SpO2 95%   BMI 33.30 kg/m    Wt Readings from Last 3 Encounters:  12/02/23 219 lb (99.3 kg)  09/05/23 219 lb 8 oz (99.6 kg)  05/24/23 221 lb (100.2 kg)    GEN: Well nourished, well developed in no acute distress NECK: No JVD; No carotid bruits CARDIAC: RRR, no murmurs, rubs, gallops RESPIRATORY:  Clear to auscultation without rales, wheezing or rhonchi  ABDOMEN: Soft, non-tender, non-distended EXTREMITIES:  No edema; No acute deformity      Assessment and Plan:  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 Ophthalmology concerned for symptoms of amaurosis fugax. [facility] initiated workup for CVA.   He was started on Plavix 75 mg daily in addition to his Aspirin 325 mg daily.  His simvastatin 20 mg daily was switched to rosuvastatin 20 mg daily.  He was also referred to neurology for further workup  Bilateral carotid artery ultrasound 11/29/2023  showed no evidence for hemodynamically significant stenosis.  The [facility] has [facility] an outpatient echocardiogram for which I had ordered today for patient. -EKG today shows sinus bradycardia without acute ischemic changes -Overall today patient is asymptomatic with no acute concerns or complaints at this time.  No symptoms to suggest amaurosis fugax -He is without any anginal symptoms, no indication for further ischemic evaluation at this time -Continue statin lowering therapy and follow-up with [facility]  Hyperlipidemia LDL 96 on 11/2023 Recently switched from simvastatin 20 mg daily to Crestor 20 mg daily with the [facility] this month He has asked to have repeat lipid panel done with cardiology service, statin therapy has been followed by [facility] -Repeat lipid panel ordered to be completed at time of echocardiogram   Essential hypertension Blood pressure today 128/74 and under excellent control -Managed on benazepril 20 mg daily -Continue  to monitor BP at home  Renal artery stenosis S/p remote LRA stent in 1996, known to be occluded now His last Doppler study in 2016 showed his left renal artery was occluded and right renal arteries were widely patent  Abdominal aortic aneurysm S/p endoluminal stent grafting in February 2013 Most recent [facility] [place] EVAR duplex 08/2023 shows patent endovascular aneurysm repair, stable in size, with no evidence of endoleak -He is without abdominal, back, chest pain -Followed by vascular surgery and last seen 08/2023            Dispo:  Return in about 6 months (around 06/03/2024).  Signed, Denyce Robert, NP

## 2023-12-02 NOTE — Patient Instructions (Signed)
 Medication Instructions:  NO CHANGES   Lab Work: CMET AND FASTING LIPID PANEL TO BE DONE DAY OF ECHO    Testing/Procedures: Your physician has requested that you have an ECHOCARDIOGRAM. Echocardiography is a painless test that uses sound waves to create images of your heart. It provides your doctor with information about the size and shape of your heart and how well your heart's chambers and valves are working. This procedure takes approximately one hour. There are no restrictions for this procedure. Please do NOT wear cologne, perfume, aftershave, or lotions (deodorant is allowed). Please arrive 15 minutes prior to your appointment time.  Please note: We ask at that you not bring children with you during ultrasound (echo/ vascular) testing. Due to room size and safety concerns, children are not allowed in the ultrasound rooms during exams. Our front office staff cannot provide observation of children in our lobby area while testing is being conducted. An adult accompanying a patient to their appointment will only be allowed in the ultrasound room at the discretion of the ultrasound technician under special circumstances. We apologize for any inconvenience.    Follow-Up: At [facility], you and your health needs are our priority.  As part of our continuing mission to provide you with exceptional heart care, we have created designated Provider Care Teams.  These Care Teams include your primary Cardiologist (physician) and Advanced Practice Providers (APPs -  Physician Assistants and Nurse Practitioners) who all work together to provide you with the care you need, when you need it.   Your next appointment:   6 MONTHS  Provider:   Nanetta Batty, MD OR [facility]   Other Instructions:

## 2023-12-02 NOTE — Telephone Encounter (Signed)
 Spoke to patient letting him know that he can continue to take his Aspirin 325 MG daily along with the Plavix. [place] looked through the [place] notes. Patient understands and has no questions.

## 2023-12-05 ENCOUNTER — Ambulatory Visit (HOSPITAL_COMMUNITY)
Admission: RE | Admit: 2023-12-05 | Discharge: 2023-12-05 | Disposition: A | Discharge status: Home / Self Care | Source: Ambulatory Visit | Attending: Cardiology | Admitting: Cardiology

## 2023-12-05 DIAGNOSIS — I10 Essential (primary) hypertension: Secondary | ICD-10-CM | POA: Diagnosis not present

## 2023-12-05 DIAGNOSIS — H34219 Partial retinal artery occlusion, unspecified eye: Secondary | ICD-10-CM | POA: Diagnosis not present

## 2023-12-05 DIAGNOSIS — E7800 Pure hypercholesterolemia, unspecified: Secondary | ICD-10-CM

## 2023-12-05 DIAGNOSIS — I701 Atherosclerosis of renal artery: Secondary | ICD-10-CM | POA: Diagnosis not present

## 2023-12-05 DIAGNOSIS — I2510 Atherosclerotic heart disease of native coronary artery without angina pectoris: Secondary | ICD-10-CM | POA: Diagnosis not present

## 2023-12-05 LAB — ECHOCARDIOGRAM COMPLETE
AR max vel: 1.36 cm2
AV Area VTI: 1.29 cm2
AV Area mean vel: 1.34 cm2
AV Mean grad: 5 mmHg
AV Peak grad: 10.1 mmHg
Ao pk vel: 1.59 m/s
Area-P 1/2: 2.66 cm2
MV M vel: 0.82 m/s
MV Peak grad: 2.7 mmHg
S' Lateral: 3.05 cm

## 2023-12-05 LAB — LIPID PANEL
Chol/HDL Ratio: 5.4 ratio — ABNORMAL HIGH (ref 0.0–5.0)
Cholesterol, Total: 183 mg/dL (ref 100–199)
HDL: 34 mg/dL — ABNORMAL LOW (ref >39)
LDL Chol Calc (NIH): 100 mg/dL — ABNORMAL HIGH (ref 0–99)
Triglycerides: 288 mg/dL — ABNORMAL HIGH (ref 0–149)
VLDL Cholesterol Cal: 49 mg/dL — ABNORMAL HIGH (ref 5–40)

## 2023-12-05 LAB — COMPREHENSIVE METABOLIC PANEL
ALT: 20 IU/L (ref 0–44)
AST: 14 IU/L (ref 0–40)
Albumin: 4.3 g/dL (ref 3.8–4.8)
Alkaline Phosphatase: 95 IU/L (ref 44–121)
BUN/Creatinine Ratio: 15 (ref 10–24)
BUN: 16 mg/dL (ref 8–27)
Bilirubin Total: 0.3 mg/dL (ref 0.0–1.2)
CO2: 24 mmol/L (ref 20–29)
Calcium: 9.9 mg/dL (ref 8.6–10.2)
Chloride: 104 mmol/L (ref 96–106)
Creatinine, Ser: 1.1 mg/dL (ref 0.76–1.27)
Globulin, Total: 2.3 g/dL (ref 1.5–4.5)
Glucose: 116 mg/dL — ABNORMAL HIGH (ref 70–99)
Potassium: 4.4 mmol/L (ref 3.5–5.2)
Sodium: 142 mmol/L (ref 134–144)
Total Protein: 6.6 g/dL (ref 6.0–8.5)
eGFR: 68 mL/min/{1.73_m2} (ref >59)

## 2023-12-08 ENCOUNTER — Other Ambulatory Visit: Payer: Self-pay | Admitting: *Deleted

## 2023-12-08 DIAGNOSIS — E7800 Pure hypercholesterolemia, unspecified: Secondary | ICD-10-CM

## 2023-12-08 DIAGNOSIS — I701 Atherosclerosis of renal artery: Secondary | ICD-10-CM

## 2023-12-08 DIAGNOSIS — I714 Abdominal aortic aneurysm, without rupture, unspecified: Secondary | ICD-10-CM

## 2023-12-08 DIAGNOSIS — I10 Essential (primary) hypertension: Secondary | ICD-10-CM

## 2023-12-12 MED ORDER — ROSUVASTATIN CALCIUM 20 MG PO TABS: 20.0000 mg | ORAL_TABLET | Freq: Every day | ORAL | 1 refills | Status: DC

## 2023-12-12 NOTE — Addendum Note (Signed)
 Addended by: Lindell Spar on: 12/12/2023 12:14 PM   Modules accepted: Orders

## 2023-12-12 NOTE — Telephone Encounter (Signed)
 Pt's wife calling back to make provider aware that the [place] ha changed pt's medication from   simvastatin (ZOCOR) 20 MG tablet  To Rosuvastatin 20 MG but pt has not received medication yet. Please advise

## 2023-12-12 NOTE — Telephone Encounter (Addendum)
 Spoke with patient's wife. [facility] on 11[facility]25/23  They changed simvastatin 20mg  > rosuvastatin 20mg  [facility] also added clopidogrel 75mg  once daily (added to med list) and Jardiance 25mg  once daily   They are requesting refills for rosuvastatin 20mg  to [facility] NP is aware of the new statin. Refilled under his name.     Denyce Robert, NP to Lucretia Field Regarding results: 2    12/07/23  9:27 AM Your comprehensive metabolic panel shows normal electrolytes, kidney function, and liver function.  Your cholesterol panel shows an elevated LDL cholesterol of 100.  Given your past medical history, your LDL goal should be less than 70.  You recently switched to Crestor 20 mg daily which should help improve your LDL cholesterol.  Lets plan to repeat your cholesterol panel in 8 weeks.  Focus on heart healthy dieting in the meantime.  Continue your current medication regimen at this time and follow-up as planned.  Last read by Lucretia Field at 9:39PM on 12/11/2023.

## 2023-12-13 ENCOUNTER — Ambulatory Visit: Payer: Medicare Other

## 2023-12-13 NOTE — Progress Notes (Signed)
 Doctor that treats patients Diabetes is a Field seismologist who would want him to see a [place] vendor for shoes  Patient can still see our Dr. For RFC as we bill part A for that and a [facility] Dr. Does not need to sign off like they have to with part B - DME  Patient will talk with VA Dr re: shoes usually VA pays for 2 pr shoes and inserts a year   Addison Bailey Cped, CFo, CFm

## 2023-12-28 ENCOUNTER — Ambulatory Visit: Payer: PRIVATE HEALTH INSURANCE | Admitting: Podiatry

## 2024-02-02 DIAGNOSIS — I10 Essential (primary) hypertension: Secondary | ICD-10-CM | POA: Diagnosis not present

## 2024-02-02 DIAGNOSIS — E7800 Pure hypercholesterolemia, unspecified: Secondary | ICD-10-CM | POA: Diagnosis not present

## 2024-02-02 DIAGNOSIS — I714 Abdominal aortic aneurysm, without rupture, unspecified: Secondary | ICD-10-CM | POA: Diagnosis not present

## 2024-02-02 DIAGNOSIS — I701 Atherosclerosis of renal artery: Secondary | ICD-10-CM | POA: Diagnosis not present

## 2024-02-03 ENCOUNTER — Ambulatory Visit: Payer: Self-pay | Admitting: Emergency Medicine

## 2024-02-03 DIAGNOSIS — E7800 Pure hypercholesterolemia, unspecified: Secondary | ICD-10-CM

## 2024-02-03 DIAGNOSIS — I10 Essential (primary) hypertension: Secondary | ICD-10-CM

## 2024-02-03 LAB — LIPID PANEL
Chol/HDL Ratio: 5.9 ratio — ABNORMAL HIGH (ref 0.0–5.0)
Cholesterol, Total: 172 mg/dL (ref 100–199)
HDL: 29 mg/dL — ABNORMAL LOW (ref >39)
LDL Chol Calc (NIH): 83 mg/dL (ref 0–99)
Triglycerides: 366 mg/dL — ABNORMAL HIGH (ref 0–149)
VLDL Cholesterol Cal: 60 mg/dL — ABNORMAL HIGH (ref 5–40)

## 2024-02-03 MED ORDER — ROSUVASTATIN CALCIUM 40 MG PO TABS: 40.0000 mg | ORAL_TABLET | Freq: Every day | ORAL | 1 refills | Status: DC

## 2024-04-30 ENCOUNTER — Other Ambulatory Visit: Payer: Self-pay | Admitting: *Deleted

## 2024-04-30 ENCOUNTER — Ambulatory Visit: Attending: Cardiovascular Disease | Admitting: Cardiovascular Disease

## 2024-04-30 ENCOUNTER — Encounter: Payer: Self-pay | Admitting: Cardiovascular Disease

## 2024-04-30 VITALS — BP 120/70 | HR 72 | Ht 69.0 in | Wt 215.0 lb

## 2024-04-30 DIAGNOSIS — E7800 Pure hypercholesterolemia, unspecified: Secondary | ICD-10-CM | POA: Insufficient documentation

## 2024-04-30 DIAGNOSIS — I7143 Infrarenal abdominal aortic aneurysm, without rupture: Secondary | ICD-10-CM | POA: Diagnosis not present

## 2024-04-30 DIAGNOSIS — I701 Atherosclerosis of renal artery: Secondary | ICD-10-CM | POA: Insufficient documentation

## 2024-04-30 DIAGNOSIS — I10 Essential (primary) hypertension: Secondary | ICD-10-CM | POA: Diagnosis not present

## 2024-04-30 NOTE — Assessment & Plan Note (Signed)
 History of hyperlipidemia on high-dose statin therapy with lipid profile performed 02/02/2024 revealing total cholesterol 172, LDL of 83 and HDL of 29.

## 2024-04-30 NOTE — Progress Notes (Signed)
 04/30/2024 SHOJI PERTUIT   03/25/1941  [id number]  Primary Physician Clinic, [facility] Primary Cardiologist: Dorn JINNY Lesches MD FACP, [place], [place], [place]  HPI:  Joseph Contreras is a 83 y.o.  mildly overweight married Caucasian male, father of 2 and grandfather of 5 grandchildren, whom I last saw 05/24/2023.  I performed PTA and stenting of his left renal artery back in April 1996. His other problems include hypertension and hyperlipidemia. We have been following his renal Doppler studies which have since shown occlusion of his left renal artery approximately 7 years ago with a patent right renal artery. We have also been following his abdominal aorta, which had grown to 5.5 cm. I referred him to Dr. Malvina New, who performed abdominal stent grafting on him February of 2013.  He had a negative Myoview  as part of that.  Preprocedure work-up which was negative.   Since I saw him a year ago he continues to do well.  Dr. New follows his endoluminal stent graft.  He denies chest pain or shortness of breath.  He did have an episode of amaurosis and was worked up at the [facility].  Apparently carotid Dopplers were normal.  He did have a 2D echo performed in our office 12/05/2023 which is essentially normal as well with grade 1 diastolic dysfunction.   Current Meds  Medication Sig   allopurinol (ZYLOPRIM) 300 MG tablet Take 300 mg by mouth daily.    aspirin  325 MG tablet Take 325 mg by mouth daily.   benazepril  (LOTENSIN ) 20 MG tablet Take 10 mg by mouth daily.   bumetanide  (BUMEX ) 0.5 MG tablet Take 0.5 mg by mouth daily.   Cholecalciferol (VITAMIN D3) 5000 units CAPS Take 5,000 Units by mouth daily.    clopidogrel (PLAVIX) 75 MG tablet Take 75 mg by mouth daily.   empagliflozin (JARDIANCE) 25 MG TABS tablet Take 25 mg by mouth daily.   fish oil-omega-3 fatty acids 1000 MG capsule Take 3 g by mouth daily.   metFORMIN (GLUCOPHAGE) 500 MG tablet Take 1,000 mg by mouth daily.    Multiple Vitamin (MULTIVITAMIN) tablet Take 1 tablet by mouth daily.   Potassium 99 MG TABS Take 297 mg by mouth daily.    rosuvastatin  (CRESTOR ) 40 MG tablet Take 1 tablet (40 mg total) by mouth daily.   triamcinolone cream (KENALOG) 0.1 % APPLY THIN LAYER TO AFFECTED AREA DAILY AS DIRECTED.  APPLY TO SCALY RASH ON ELBOWS ONCE A DAY. NEVER TO FACE, UNDERARMS, OR  GROIN. AS DIRECTED.  APPLY TO SCALY RASH ON ELBOWS ONCE A DAY. NEVER TO FACE, UNDERARMS, OR   GROIN.     Allergies  Allergen Reactions   Amoxicillin Diarrhea and Nausea Only    Amoxicillin-Clav   Empagliflozin Anaphylaxis   Amoxicillin-Pot Clavulanate Diarrhea and Nausea And Vomiting   Codeine Nausea And Vomiting and Nausea Only    Social History   Socioeconomic History   Marital status: Married    Spouse name: Not on file   Number of children: Not on file   Years of education: Not on file   Highest education level: Not on file  Occupational History   Not on file  Tobacco Use   Smoking status: Former    Current packs/day: 0.00    Types: Cigarettes    Quit date: 10/05/1966    Years since quitting: 57.6   Smokeless tobacco: Never  Vaping Use   Vaping status: Never Used  Substance and Sexual  Activity   Alcohol use: No   Drug use: No   Sexual activity: Not on file  Other Topics Concern   Not on file  Social History Narrative   Not on file   Social Drivers of Health   Financial Resource Strain: Not on file  Food Insecurity: Not on file  Transportation Needs: Not on file  Physical Activity: Not on file  Stress: Not on file  Social Connections: Not on file  Intimate Partner Violence: Not on file     Review of Systems: General: negative for chills, fever, night sweats or weight changes.  Cardiovascular: negative for chest pain, dyspnea on exertion, edema, orthopnea, palpitations, paroxysmal nocturnal dyspnea or shortness of breath Dermatological: negative for rash Respiratory: negative for cough or  wheezing Urologic: negative for hematuria Abdominal: negative for nausea, vomiting, diarrhea, bright red blood per rectum, melena, or hematemesis Neurologic: negative for visual changes, syncope, or dizziness All other systems reviewed and are otherwise negative except as noted above.    Blood pressure 120/70, pulse 72, height 5' 9 (1.753 m), weight 215 lb (97.5 kg), SpO2 97%.  General appearance: alert and no distress Neck: no adenopathy, no carotid bruit, no JVD, supple, symmetrical, trachea midline, and thyroid  not enlarged, symmetric, no tenderness/mass/nodules Lungs: clear to auscultation bilaterally Heart: regular rate and rhythm, S1, S2 normal, no murmur, click, rub or gallop Extremities: extremities normal, atraumatic, no cyanosis or edema Pulses: 2+ and symmetric Skin: Skin color, texture, turgor normal. No rashes or lesions Neurologic: Grossly normal  EKG not performed today      ASSESSMENT AND PLAN:   Hypercholesterolemia History of hyperlipidemia on high-dose statin therapy with lipid profile performed 02/02/2024 revealing total cholesterol 172, LDL of 83 and HDL of 29.  AAA (abdominal aortic aneurysm)- S/P Ao stent graft 2/13 History of abdominal aortic aneurysm status post endoluminal stent grafting by Dr. Serene 2/13 which he follows.  Renal artery stenosis- remote LRA stent, known to be occluded now History of left renal artery stenosis which I stented in April 1996.  It was shown to be close by Doppler studies in the past.  Essential hypertension History of essential hypertension blood pressure measured today 120/70.  He is on Lotensin .     Dorn DOROTHA Lesches MD FACP,FACC,FAHA, [facility] 04/30/2024 10:41 AM

## 2024-04-30 NOTE — Assessment & Plan Note (Signed)
 History of left renal artery stenosis which I stented in April 1996.  It was shown to be close by Doppler studies in the past.

## 2024-04-30 NOTE — Patient Instructions (Signed)

## 2024-04-30 NOTE — Assessment & Plan Note (Signed)
 History of abdominal aortic aneurysm status post endoluminal stent grafting by Dr. Serene 2/13 which he follows.

## 2024-04-30 NOTE — Assessment & Plan Note (Signed)
 History of essential hypertension blood pressure measured today 120/70.  He is on Lotensin .

## 2024-05-01 LAB — LIPID PANEL
Chol/HDL Ratio: 3.4 ratio (ref 0.0–5.0)
Cholesterol, Total: 111 mg/dL (ref 100–199)
HDL: 33 mg/dL — ABNORMAL LOW (ref >39)
LDL Chol Calc (NIH): 43 mg/dL (ref 0–99)
Triglycerides: 219 mg/dL — ABNORMAL HIGH (ref 0–149)
VLDL Cholesterol Cal: 35 mg/dL (ref 5–40)

## 2024-05-02 ENCOUNTER — Ambulatory Visit: Payer: Self-pay | Admitting: Nurse Practitioner

## 2024-06-10 ENCOUNTER — Other Ambulatory Visit: Payer: Self-pay | Admitting: Emergency Medicine

## 2024-06-25 DIAGNOSIS — Z23 Encounter for immunization: Secondary | ICD-10-CM | POA: Diagnosis not present

## 2024-07-30 ENCOUNTER — Other Ambulatory Visit: Payer: Self-pay | Admitting: Emergency Medicine

## 2024-09-04 ENCOUNTER — Telehealth (HOSPITAL_BASED_OUTPATIENT_CLINIC_OR_DEPARTMENT_OTHER): Payer: Self-pay | Admitting: *Deleted

## 2024-09-04 NOTE — Telephone Encounter (Signed)
° °  Pre-operative Risk Assessment    Patient Name: Joseph Contreras  DOB: 06-04-1941 MRN: [id number]   Date of last office visit: 04/30/24 DR. BERRY Date of next office visit: NONE   Request for Surgical Clearance    Procedure:  RIGHT BLEPHAROPLASTY AND BROWPEXY   Date of Surgery:  Clearance TBD                                Surgeon:  NOT LISTED Surgeon's Group or Practice Name:  [facility]  Phone number:  [phone] [facility], RN  Fax number:  [phone]   Type of Clearance Requested:   - Medical  - Pharmacy:  Hold Aspirin  and Clopidogrel (Plavix)     Type of Anesthesia:  Local    Additional requests/questions:    [name]   09/04/2024, 5:02 PM

## 2024-09-05 NOTE — Telephone Encounter (Signed)
 Will route to Dr. Court regarding holding Plavix x 5 days and aspirin  x 7 days. Patient is on Plavix 75mg  daily and ASA 325mg  daily per [facility]. I cannot see that this is for cardiac reason, though does have vascular history to include remote renal artery stenting with subsequent occlusion, as well as AAA stent grafting. Last OV 04/2024 also outlines episode of amaurosis fugax, note indicates workup at [facility] reported to be OK. We will likely recommend that surgeon reach out to formal prescriber of Plavix as it does not appear to be filled from our office as well as vascular surgeon, but appreciate input from Dr. Court if OK to hold from cardiac standpoint. Dr. Court - Please route response to P CV DIV PREOP (the pre-op pool). Thank you.  Once MD replies, plan VV.

## 2024-09-10 ENCOUNTER — Telehealth: Payer: Self-pay

## 2024-09-10 NOTE — Telephone Encounter (Signed)
" ° °  Name: Joseph Contreras  DOB: [birth year]  MRN: [id number]  Primary Cardiologist: Dorn Lesches, MD   Preoperative team, please contact this patient and set up a phone call appointment for further preoperative risk assessment. Please obtain consent and complete medication review. Thank you for your help.  I confirm that guidance regarding antiplatelet and oral anticoagulation therapy has been completed and, if necessary, noted below. When appointment confirmed, please route update to surgeon that they will need to also reach out to patient's vascular surgeon for input on holding antiplatelets (follows with [facility]).  I also confirmed the patient resides in the state of [place] . As per [facility] Medical Board telemedicine laws, the patient must reside in the state in which the provider is licensed.   Raphael LOISE Bring, PA-C 09/10/2024, 4:46 PM [facility]    "

## 2024-09-10 NOTE — Telephone Encounter (Signed)
"  °  Patient Consent for Virtual Visit        Joseph Contreras has provided verbal consent on 09/10/2024 for a virtual visit (video or telephone).   CONSENT FOR VIRTUAL VISIT FOR:  Joseph Contreras  By participating in this virtual visit I agree to the following:  I hereby voluntarily request, consent and authorize [facility] and its employed or contracted physicians, physician assistants, nurse practitioners or other licensed health care professionals (the Practitioner), to provide me with telemedicine health care services (the Services) as deemed necessary by the treating Practitioner. I acknowledge and consent to receive the Services by the Practitioner via telemedicine. I understand that the telemedicine visit will involve communicating with the Practitioner through live audiovisual communication technology and the disclosure of certain medical information by electronic transmission. I acknowledge that I have been given the opportunity to request an in-person assessment or other available alternative prior to the telemedicine visit and am voluntarily participating in the telemedicine visit.  I understand that I have the right to withhold or withdraw my consent to the use of telemedicine in the course of my care at any time, without affecting my right to future care or treatment, and that the Practitioner or I may terminate the telemedicine visit at any time. I understand that I have the right to inspect all information obtained and/or recorded in the course of the telemedicine visit and may receive copies of available information for a reasonable fee.  I understand that some of the potential risks of receiving the Services via telemedicine include:  Delay or interruption in medical evaluation due to technological equipment failure or disruption; Information transmitted may not be sufficient (e.g. poor resolution of images) to allow for appropriate medical decision making by the Practitioner;  and/or  In rare instances, security protocols could fail, causing a breach of personal health information.  Furthermore, I acknowledge that it is my responsibility to provide information about my medical history, conditions and care that is complete and accurate to the best of my ability. I acknowledge that Practitioner's advice, recommendations, and/or decision may be based on factors not within their control, such as incomplete or inaccurate data provided by me or distortions of diagnostic images or specimens that may result from electronic transmissions. I understand that the practice of medicine is not an exact science and that Practitioner makes no warranties or guarantees regarding treatment outcomes. I acknowledge that a copy of this consent can be made available to me via my patient portal [facility] MyChart), or I can request a printed copy by calling the office of [facility].    I understand that my insurance will be billed for this visit.   I have read or had this consent read to me. I understand the contents of this consent, which adequately explains the benefits and risks of the Services being provided via telemedicine.  I have been provided ample opportunity to ask questions regarding this consent and the Services and have had my questions answered to my satisfaction. I give my informed consent for the services to be provided through the use of telemedicine in my medical care    "

## 2024-09-10 NOTE — Telephone Encounter (Signed)
 Preop tele appt now scheduled, med rec and consent done.

## 2024-09-12 ENCOUNTER — Other Ambulatory Visit (HOSPITAL_BASED_OUTPATIENT_CLINIC_OR_DEPARTMENT_OTHER): Payer: Self-pay

## 2024-09-12 ENCOUNTER — Encounter (HOSPITAL_BASED_OUTPATIENT_CLINIC_OR_DEPARTMENT_OTHER): Payer: Self-pay | Admitting: Emergency Medicine

## 2024-09-12 ENCOUNTER — Ambulatory Visit (HOSPITAL_BASED_OUTPATIENT_CLINIC_OR_DEPARTMENT_OTHER)
Admission: EM | Admit: 2024-09-12 | Discharge: 2024-09-12 | Disposition: A | Discharge status: Home / Self Care | Attending: Family Medicine | Admitting: Family Medicine

## 2024-09-12 DIAGNOSIS — J0110 Acute frontal sinusitis, unspecified: Secondary | ICD-10-CM | POA: Diagnosis not present

## 2024-09-12 LAB — POC COVID19/FLU A&B COMBO
Covid Antigen, POC: NEGATIVE
Influenza A Antigen, POC: NEGATIVE
Influenza B Antigen, POC: NEGATIVE

## 2024-09-12 MED ORDER — DOXYCYCLINE HYCLATE 100 MG PO CAPS
100.0000 mg | ORAL_CAPSULE | Freq: Two times a day (BID) | ORAL | 0 refills | Status: AC
  Filled 2024-09-12: qty 20, 10d supply, fill #0

## 2024-09-12 NOTE — ED Triage Notes (Signed)
 Pt reports for a week having nasal congestion, ear fullness and some pain, cough and sneezing. Taking Mucinex .

## 2024-09-12 NOTE — ED Provider Notes (Signed)
 " [facility]    CSN: [id number] Arrival date & time: 09/12/24  1227      History   Chief Complaint Chief Complaint  Patient presents with   Nasal Congestion    HPI Joseph Contreras is a 83 y.o. male.   Pt is an 83 year old man that presents with 1 week of   nasal congestion, ear fullness and some pain, cough and sneezing. Taking Mucinex . 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   Actinic keratosis 06/02/2021   Bilateral pseudophakia 06/02/2021   Dental caries on smooth surface penetrating into dentin 06/02/2021   Deposits (accretions) on teeth 06/02/2021   Diseases of lips 06/02/2021   Disorder of the skin and subcutaneous tissue, unspecified 06/02/2021   Abnormal weight gain 06/02/2021   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   Encounter for fitting and adjustment of hearing aid 06/02/2021   Encounter for screening for eye and ear disorders 06/02/2021   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 06/02/2021   Impacted cerumen, bilateral 06/02/2021   Myogenic ptosis of bilateral eyelids 06/02/2021   Myogenic ptosis of unspecified eyelid 06/02/2021   Neoplasm of uncertain behavior of skin 06/02/2021   Obstructive sleep apnea (adult) (pediatric) 06/02/2021   Other specified periodontal diseases 06/02/2021   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   Sialolithiasis 06/02/2021   Basal cell carcinoma of upper lip 06/02/2021   Tinnitus 06/02/2021   Type 2 diabetes mellitus with diabetic neuropathy, unspecified (HCC) 06/02/2021   Unspecified lesions of oral mucosa 06/02/2021   Bilateral sensorineural hearing loss 06/02/2021   History of gout 03/15/2018   Mixed dyslipidemia 03/15/2018   Aftercare following surgery of the circulatory system, NEC 02/25/2014   Essential hypertension 10/30/2013   Aneurysm of abdominal vessel 08/20/2013   Renal artery stenosis- remote LRA stent, known to be occluded now 04/09/2013   Normal coronary arteries- 1996, low risk Myoview  Jan 2013 04/09/2013   Gout 04/09/2013   Hematuria- negative work up at the [facility] 04/09/2013   Agent orange exposure 04/09/2013   AAA (abdominal aortic aneurysm)- S/P Ao stent graft 2/13 10/11/2011   Hypercholesterolemia     Past Surgical History:  Procedure Laterality Date   ABDOMINAL AORTIC ENDOVASCULAR STENT GRAFT  Feb 2013   CARDIAC CATHETERIZATION  1995   normal cornaries   EYE SURGERY  2.5.13   retinal detachment     EYE SURGERY Right December 31, 2014   Cataract   EYE SURGERY Left Jan 19, 2015   Cataract   GANGLION CYST EXCISION  79   rt wrist   LEFT RENAL STENT  1996   now occluded   TONSILLECTOMY  1949   VASECTOMY  09/1988  Home Medications    Prior to Admission medications  Medication Sig Start Date End Date Taking? Authorizing Provider  doxycycline  (VIBRAMYCIN ) 100 MG capsule Take 1 capsule (100 mg total) by mouth 2 (two) times daily. 09/12/24  Yes Jeston Junkins A, FNP  allopurinol (ZYLOPRIM) 300 MG tablet Take 300 mg by mouth daily.     [provider]  aspirin  325 MG tablet Take 325 mg by mouth daily.    [provider]  benazepril  (LOTENSIN ) 20 MG tablet Take 10 mg by mouth daily.    [provider]  bumetanide  (BUMEX ) 0.5 MG tablet Take 0.5 mg by mouth daily.    [provider]   Cholecalciferol (VITAMIN D3) 5000 units CAPS Take 5,000 Units by mouth daily.     [provider]  clopidogrel (PLAVIX) 75 MG tablet Take 75 mg by mouth daily.    [provider]  empagliflozin (JARDIANCE) 25 MG TABS tablet Take 25 mg by mouth daily. 11/25/23   [provider]  fish oil-omega-3 fatty acids 1000 MG capsule Take 3 g by mouth daily.    [provider]  indomethacin (INDOCIN) 50 MG capsule Take 50 mg by mouth as needed.    [provider]  metFORMIN (GLUCOPHAGE) 500 MG tablet Take 1,000 mg by mouth daily.    [provider]  Multiple Vitamin (MULTIVITAMIN) tablet Take 1 tablet by mouth daily.    [provider]  Potassium 99 MG TABS Take 297 mg by mouth daily.     [provider]  rosuvastatin  (CRESTOR ) 40 MG tablet TAKE 1 TABLET(40 MG) BY MOUTH DAILY 07/31/24   Fountain, Madison L, NP  triamcinolone cream (KENALOG) 0.1 % APPLY THIN LAYER TO AFFECTED AREA DAILY AS DIRECTED.  APPLY TO SCALY RASH ON ELBOWS ONCE A DAY. NEVER TO FACE, UNDERARMS, OR  GROIN. AS DIRECTED.  APPLY TO SCALY RASH ON ELBOWS ONCE A DAY. NEVER TO FACE, UNDERARMS, OR   GROIN. 12/08/20   [provider]    Family History Family History  Problem Relation Age of Onset   Heart disease Father    Heart attack Father    Diabetes Mother    Hyperlipidemia Sister    Hypertension Sister    Hyperlipidemia Brother    Hypertension Brother     Social History Social History[1]   Allergies   Amoxicillin, Empagliflozin, Amoxicillin-pot clavulanate, and Codeine   Review of Systems Review of Systems See HPI  Physical Exam Triage Vital Signs ED Triage Vitals  Encounter Vitals Group     BP 09/12/24 1245 132/76     Girls Systolic BP Percentile --      Girls Diastolic BP Percentile --      Boys Systolic BP Percentile --      Boys Diastolic BP Percentile --      Pulse Rate 09/12/24 1245 91     Resp 09/12/24 1245 19     Temp 09/12/24  1245 98.4 F (36.9 C)     Temp Source 09/12/24 1245 Oral     SpO2 09/12/24 1245 92 %     Weight --      Height --      Head Circumference --      Peak Flow --      Pain Score 09/12/24 1244 0     Pain Loc --      Pain Education --      Exclude from Growth Chart --    No data found.  Updated Vital Signs BP 132/76 (BP Location: Left Arm)   Pulse 91   Temp 98.4 F (36.9 C) (Oral)   Resp 19   SpO2 92%   Visual Acuity Right Eye Distance:   Left Eye Distance:   Bilateral Distance:    Right Eye Near:   Left Eye Near:    Bilateral Near:     Physical Exam Constitutional:      General: He is not in acute distress.    Appearance: Normal appearance. He is not ill-appearing, toxic-appearing or diaphoretic.  HENT:     Right Ear: Tympanic membrane, ear canal and external ear normal.     Left Ear: Tympanic membrane, ear canal and external ear normal.     Nose: Congestion present.     Mouth/Throat:     Pharynx: Oropharynx is clear.  Eyes:     Conjunctiva/sclera: Conjunctivae normal.  Cardiovascular:     Rate and Rhythm: Normal rate and regular rhythm.     Pulses: Normal pulses.     Heart sounds: Normal heart sounds.  Pulmonary:     Effort: Pulmonary effort is normal.     Breath sounds: Normal breath sounds.  Musculoskeletal:        General: Normal range of motion.  Skin:    General: Skin is warm and dry.  Neurological:     Mental Status: He is alert.  Psychiatric:        Mood and Affect: Mood normal.      UC Treatments / Results  Labs (all labs ordered are listed, but only abnormal results are displayed) Labs Reviewed  POC COVID19/FLU A&B COMBO - Normal    EKG   Radiology No results found.  Procedures Procedures (including critical care time)  Medications Ordered in UC Medications - No data to display  Initial Impression / Assessment and Plan / UC Course  I have reviewed the triage vital signs and the nursing notes.  Pertinent labs & imaging results  that were available during my care of the patient were reviewed by me and considered in my medical decision making (see chart for details).     Sinusitis- Treating for a sinus infection.  Take the antibiotics as prescribed.  You can do over-the-counter sinus medication as needed that is safe with your other medications.  Flonase nasal spray. Final Clinical Impressions(s) / UC Diagnoses   Final diagnoses:  Acute non-recurrent frontal sinusitis     Discharge Instructions      Treating you for a sinus infection.  Take the antibiotics as prescribed.  You can do over-the-counter sinus medication as needed that is safe with your other medications.  Flonase nasal spray.     ED Prescriptions     Medication Sig Dispense Auth. Provider   doxycycline  (VIBRAMYCIN ) 100 MG capsule Take 1 capsule (100 mg total) by mouth 2 (two) times daily. 20 capsule Adah Wilbert LABOR, FNP      PDMP not reviewed this encounter.     [1]  Social History Tobacco Use   Smoking status: Former    Current packs/day: 0.00    Average packs/day: 1.0 packs/day    Types: Cigarettes    Quit date: 10/05/1966    Years since quitting: 57.9   Smokeless tobacco: Never  Vaping Use   Vaping status: Never Used  Substance Use Topics   Alcohol use: No   Drug use: No     Adah Wilbert LABOR, FNP 09/12/24 1347  "

## 2024-09-12 NOTE — Discharge Instructions (Addendum)
 Treating you for a sinus infection.  Take the antibiotics as prescribed.  You can do over-the-counter sinus medication as needed that is safe with your other medications.  Flonase nasal spray.

## 2024-09-17 ENCOUNTER — Telehealth: Payer: Self-pay | Admitting: Cardiovascular Disease

## 2024-09-17 ENCOUNTER — Ambulatory Visit

## 2024-09-17 NOTE — Telephone Encounter (Signed)
 Pt wife calling to reschedule tele appt. Please follow up. Requested cll be after 4pm.

## 2024-09-17 NOTE — Telephone Encounter (Signed)
 Me    09/17/24  9:44 AM Note I s/w the pt's wife and and she if not after 4 pm then another would be great as she said today is not a good day.    Pt's wife has rescheduled the pt to 09/26/24 3 pm. Pt's wife thanked me for the help.       09/17/24  9:43 AM You contacted Klomp,[name] (Emergency Contact)    09/17/24  9:33 AM Kirby, Jada routed this conversation to Cv Div Preop Callback (Selected Message)  Joseph Contreras   09/17/24  9:33 AM Note Pt wife calling to reschedule tele appt. Please follow up. Requested cll be after 4pm.         09/17/24  9:32 AM Steinhauser,[name] (Emergency Contact) contacted Kirby, Jada

## 2024-09-17 NOTE — Telephone Encounter (Signed)
 I s/w the pt's wife and and she if not after 4 pm then another would be great as she said today is not a good day.   Pt's wife has rescheduled the pt to 09/26/24 3 pm. Pt's wife thanked me for the help.

## 2024-09-26 ENCOUNTER — Ambulatory Visit: Admitting: Nurse Practitioner

## 2024-09-26 DIAGNOSIS — Z01810 Encounter for preprocedural cardiovascular examination: Secondary | ICD-10-CM | POA: Diagnosis not present

## 2024-09-26 NOTE — Progress Notes (Signed)
 "   Virtual Visit via Telephone Note   Because of Joseph Contreras co-morbid illnesses, he is at least at moderate risk for complications without adequate follow up.  This format is felt to be most appropriate for this patient at this time.  Due to technical limitations with video connection web designer), today's appointment will be conducted as an audio only telehealth visit, and Joseph Contreras verbally agreed to proceed in this manner.   All issues noted in this document were discussed and addressed.  No physical exam could be performed with this format.  Evaluation Performed:  Preoperative cardiovascular risk assessment _____________   Date:  09/26/2024   Patient ID:  Joseph Contreras, Joseph Contreras Feb 11, 1941, MRN [id number] Patient Location:  Home Provider location:   Office  Primary Care Provider:  [facility] Primary Cardiologist:  Dorn Lesches, MD  Chief Complaint / Patient Profile   84 y.o. y/o male with a h/o AAA s/p endoluminal stent grafting, renal artery stenosis s/p remote LRA stent with known occlusion, hypertension, hyperlipidemia, and gout who is pending RIGHT BLEPHAROPLASTY AND BROWPEXY with [facility], [place] and presents today for telephonic preoperative cardiovascular risk assessment.  History of Present Illness    Joseph Contreras is a 84 y.o. male who presents via audio/video conferencing for a telehealth visit today.  Pt was last seen in cardiology clinic on 04/30/2024 by Dr.Berry.  At that time Joseph Contreras was doing well.  The patient is now pending procedure as outlined above. Since his last visit, he has done well from a cardiac standpoint.   He denies chest pain, palpitations, dyspnea, pnd, orthopnea, n, v, dizziness, syncope, edema, weight gain, or early satiety. All other systems reviewed and are otherwise negative except as noted above.   Past Medical History    Past Medical History:  Diagnosis Date   AAA (abdominal aortic aneurysm) 10/29/11   endovascular stent graft    Cancer (HCC)    melanoma, squesmous cell hand, back   Gout    Gout    Hypercholesterolemia    Hypertension    Kidney atrophy    left, known occluded LRA stent   Normal coronary arteries 1996   low risk Myoview  Jan 2013   Renal artery stenosis April 1996   LRA stent- now known to be occluded   Past Surgical History:  Procedure Laterality Date   ABDOMINAL AORTIC ENDOVASCULAR STENT GRAFT  Feb 2013   CARDIAC CATHETERIZATION  1995   normal cornaries   EYE SURGERY  2.5.13   retinal detachment     EYE SURGERY Right December 31, 2014   Cataract   EYE SURGERY Left Jan 19, 2015   Cataract   GANGLION CYST EXCISION  79   rt wrist   LEFT RENAL STENT  1996   now occluded   TONSILLECTOMY  1949   VASECTOMY  09/1988    Allergies  Allergies[1]  Home Medications    Prior to Admission medications  Medication Sig Start Date End Date Taking? Authorizing Provider  allopurinol (ZYLOPRIM) 300 MG tablet Take 300 mg by mouth daily.     [provider]  aspirin  325 MG tablet Take 325 mg by mouth daily.    [provider]  benazepril  (LOTENSIN ) 20 MG tablet Take 10 mg by mouth daily.    [provider]  bumetanide  (BUMEX ) 0.5 MG tablet Take 0.5 mg by mouth daily.    [provider]  Cholecalciferol (VITAMIN D3) 5000 units CAPS Take 5,000  Units by mouth daily.     [provider]  clopidogrel (PLAVIX) 75 MG tablet Take 75 mg by mouth daily.    [provider]  doxycycline  (VIBRAMYCIN ) 100 MG capsule Take 1 capsule (100 mg total) by mouth 2 (two) times daily. 09/12/24   Adah Corning A, FNP  empagliflozin (JARDIANCE) 25 MG TABS tablet Take 25 mg by mouth daily. 11/25/23   [provider]  fish oil-omega-3 fatty acids 1000 MG capsule Take 3 g by mouth daily.    [provider]  indomethacin (INDOCIN) 50 MG capsule Take 50 mg by mouth as needed.    [provider]  metFORMIN (GLUCOPHAGE) 500 MG tablet Take 1,000 mg by mouth  daily.    [provider]  Multiple Vitamin (MULTIVITAMIN) tablet Take 1 tablet by mouth daily.    [provider]  Potassium 99 MG TABS Take 297 mg by mouth daily.     [provider]  rosuvastatin  (CRESTOR ) 40 MG tablet TAKE 1 TABLET(40 MG) BY MOUTH DAILY 07/31/24   Fountain, Madison L, NP  triamcinolone cream (KENALOG) 0.1 % APPLY THIN LAYER TO AFFECTED AREA DAILY AS DIRECTED.  APPLY TO SCALY RASH ON ELBOWS ONCE A DAY. NEVER TO FACE, UNDERARMS, OR  GROIN. AS DIRECTED.  APPLY TO SCALY RASH ON ELBOWS ONCE A DAY. NEVER TO FACE, UNDERARMS, OR   GROIN. 12/08/20   [provider]    Physical Exam    Vital Signs:  Joseph Contreras does not have vital signs available for review today.  Given telephonic nature of communication, physical exam is limited. AAOx3. NAD. Normal affect.  Speech and respirations are unlabored.  Accessory Clinical Findings    None  Assessment & Plan    1.  Preoperative Cardiovascular Risk Assessment:  According to the Revised Cardiac Risk Index (RCRI), his Perioperative Risk of Major Cardiac Event is (%): 0.4. His Functional Capacity in METs is: 5.72 according to the Duke Activity Status Index (DASI).Therefore, based on ACC/AHA guidelines, patient would be at acceptable risk for the planned procedure without further cardiovascular testing.   The patient was advised that if he develops new symptoms prior to surgery to contact our office to arrange for a follow-up visit, and he verbalized understanding.  Per Dr. Court, he may hold Plavix for 5 days prior to procedure. Please resume Plavix as soon as possible postprocedure, at the discretion of the surgeon. Regarding ASA therapy, we recommend continuation of ASA throughout the perioperative period.  However, if the surgeon feels that cessation of ASA is required in the perioperative period, it may be stopped 5-7 days prior to surgery with a plan to resume it as soon as felt to be feasible from  a surgical standpoint in the post-operative period.   A copy of this note will be routed to requesting surgeon.  Time:   Today, I have spent 5 minutes with the patient with telehealth technology discussing medical history, symptoms, and management plan.     Damien JAYSON Braver, NP  09/26/2024, 4:08 PM     [1]  Allergies Allergen Reactions   Amoxicillin Diarrhea and Nausea Only    Amoxicillin-Clav   Empagliflozin Anaphylaxis   Amoxicillin-Pot Clavulanate Diarrhea and Nausea And Vomiting   Codeine Nausea And Vomiting and Nausea Only   "
